# Patient Record
Sex: Female | Born: 1945
Health system: Southern US, Community
[De-identification: ages and names within clinical notes are randomized; demographics above are authoritative.]

## PROBLEM LIST (undated history)

## (undated) DIAGNOSIS — M1990 Unspecified osteoarthritis, unspecified site: Secondary | ICD-10-CM

## (undated) DIAGNOSIS — E785 Hyperlipidemia, unspecified: Secondary | ICD-10-CM

## (undated) HISTORY — PX: JOINT REPLACEMENT: SHX530

## (undated) HISTORY — PX: EYE SURGERY: SHX253

## (undated) HISTORY — PX: TONSILLECTOMY AND ADENOIDECTOMY: SUR1326

## (undated) HISTORY — PX: TUBAL LIGATION: SHX77

## (undated) HISTORY — DX: Hyperlipidemia, unspecified: E78.5

---

## 2001-02-22 ENCOUNTER — Other Ambulatory Visit: Admission: RE | Admit: 2001-02-22 | Discharge: 2001-02-22 | Payer: Self-pay | Admitting: Family Medicine

## 2004-06-15 ENCOUNTER — Ambulatory Visit: Payer: Self-pay | Admitting: Family Medicine

## 2005-07-20 ENCOUNTER — Ambulatory Visit: Payer: Self-pay | Admitting: Family Medicine

## 2006-02-23 ENCOUNTER — Ambulatory Visit: Payer: Self-pay | Admitting: General Surgery

## 2006-07-25 ENCOUNTER — Ambulatory Visit: Payer: Self-pay | Admitting: Family Medicine

## 2007-07-26 DIAGNOSIS — E039 Hypothyroidism, unspecified: Secondary | ICD-10-CM | POA: Insufficient documentation

## 2007-08-14 ENCOUNTER — Ambulatory Visit: Payer: Self-pay | Admitting: Family Medicine

## 2007-12-25 ENCOUNTER — Ambulatory Visit: Payer: Self-pay | Admitting: Urology

## 2011-05-16 ENCOUNTER — Ambulatory Visit: Payer: Self-pay

## 2011-06-16 ENCOUNTER — Ambulatory Visit: Payer: Self-pay | Admitting: Family Medicine

## 2012-05-30 ENCOUNTER — Ambulatory Visit: Payer: Self-pay | Admitting: Family Medicine

## 2013-03-27 LAB — CBC AND DIFFERENTIAL
HEMATOCRIT: 42 % (ref 36–46)
HEMOGLOBIN: 13.8 g/dL (ref 12.0–16.0)
Neutrophils Absolute: 3 /uL
Platelets: 353 10*3/uL (ref 150–399)
WBC: 5.4 10^3/mL

## 2013-06-05 ENCOUNTER — Ambulatory Visit: Payer: Self-pay | Admitting: Family Medicine

## 2013-11-22 ENCOUNTER — Ambulatory Visit: Payer: Self-pay | Admitting: Family Medicine

## 2014-01-13 ENCOUNTER — Other Ambulatory Visit: Payer: Self-pay | Admitting: Neurosurgery

## 2014-01-13 DIAGNOSIS — M5136 Other intervertebral disc degeneration, lumbar region: Secondary | ICD-10-CM

## 2014-04-23 DIAGNOSIS — R319 Hematuria, unspecified: Secondary | ICD-10-CM | POA: Diagnosis not present

## 2014-04-23 DIAGNOSIS — Z0000 Encounter for general adult medical examination without abnormal findings: Secondary | ICD-10-CM | POA: Diagnosis not present

## 2014-04-23 DIAGNOSIS — R87610 Atypical squamous cells of undetermined significance on cytologic smear of cervix (ASC-US): Secondary | ICD-10-CM | POA: Diagnosis not present

## 2014-04-23 DIAGNOSIS — M1990 Unspecified osteoarthritis, unspecified site: Secondary | ICD-10-CM | POA: Diagnosis not present

## 2014-04-23 DIAGNOSIS — Z23 Encounter for immunization: Secondary | ICD-10-CM | POA: Diagnosis not present

## 2014-04-23 DIAGNOSIS — Z1389 Encounter for screening for other disorder: Secondary | ICD-10-CM | POA: Diagnosis not present

## 2014-04-23 DIAGNOSIS — E780 Pure hypercholesterolemia: Secondary | ICD-10-CM | POA: Diagnosis not present

## 2014-04-23 DIAGNOSIS — E039 Hypothyroidism, unspecified: Secondary | ICD-10-CM | POA: Diagnosis not present

## 2014-04-23 LAB — BASIC METABOLIC PANEL
BUN: 26 mg/dL — AB (ref 4–21)
Creatinine: 0.6 mg/dL (ref 0.5–1.1)
GLUCOSE: 96 mg/dL
POTASSIUM: 4.6 mmol/L (ref 3.4–5.3)
SODIUM: 141 mmol/L (ref 137–147)

## 2014-04-23 LAB — LIPID PANEL
Cholesterol: 226 mg/dL — AB (ref 0–200)
HDL: 72 mg/dL — AB (ref 35–70)
LDL Cholesterol: 133 mg/dL
Triglycerides: 106 mg/dL (ref 40–160)

## 2014-04-23 LAB — HEPATIC FUNCTION PANEL
ALT: 16 U/L (ref 7–35)
AST: 21 U/L (ref 13–35)
Alkaline Phosphatase: 71 U/L (ref 25–125)
BILIRUBIN, TOTAL: 0.4 mg/dL

## 2014-04-23 LAB — TSH: TSH: 2.91 u[IU]/mL (ref 0.41–5.90)

## 2014-04-23 LAB — CBC AND DIFFERENTIAL: WBC: 5.5 10^3/mL

## 2014-05-21 ENCOUNTER — Ambulatory Visit: Payer: Self-pay | Admitting: Family Medicine

## 2014-05-21 DIAGNOSIS — Z13820 Encounter for screening for osteoporosis: Secondary | ICD-10-CM | POA: Diagnosis not present

## 2014-05-21 DIAGNOSIS — M8580 Other specified disorders of bone density and structure, unspecified site: Secondary | ICD-10-CM | POA: Diagnosis not present

## 2014-05-21 DIAGNOSIS — M8589 Other specified disorders of bone density and structure, multiple sites: Secondary | ICD-10-CM | POA: Diagnosis not present

## 2014-05-21 LAB — HM DEXA SCAN

## 2014-07-24 ENCOUNTER — Ambulatory Visit
Admit: 2014-07-24 | Disposition: A | Discharge status: Home / Self Care | Payer: Self-pay | Attending: Gastroenterology | Admitting: Gastroenterology

## 2014-07-24 DIAGNOSIS — R319 Hematuria, unspecified: Secondary | ICD-10-CM | POA: Diagnosis not present

## 2014-07-24 DIAGNOSIS — E039 Hypothyroidism, unspecified: Secondary | ICD-10-CM | POA: Diagnosis not present

## 2014-07-24 DIAGNOSIS — M810 Age-related osteoporosis without current pathological fracture: Secondary | ICD-10-CM | POA: Diagnosis not present

## 2014-07-24 DIAGNOSIS — K641 Second degree hemorrhoids: Secondary | ICD-10-CM | POA: Diagnosis not present

## 2014-07-24 DIAGNOSIS — E780 Pure hypercholesterolemia: Secondary | ICD-10-CM | POA: Diagnosis not present

## 2014-07-24 DIAGNOSIS — Z7982 Long term (current) use of aspirin: Secondary | ICD-10-CM | POA: Diagnosis not present

## 2014-07-24 DIAGNOSIS — K649 Unspecified hemorrhoids: Secondary | ICD-10-CM | POA: Diagnosis not present

## 2014-07-24 DIAGNOSIS — M479 Spondylosis, unspecified: Secondary | ICD-10-CM | POA: Diagnosis not present

## 2014-07-24 DIAGNOSIS — Z1211 Encounter for screening for malignant neoplasm of colon: Secondary | ICD-10-CM | POA: Diagnosis not present

## 2014-07-24 LAB — HM COLONOSCOPY

## 2014-10-06 ENCOUNTER — Other Ambulatory Visit: Payer: Self-pay | Admitting: Family Medicine

## 2014-10-06 DIAGNOSIS — Z1231 Encounter for screening mammogram for malignant neoplasm of breast: Secondary | ICD-10-CM

## 2014-10-08 ENCOUNTER — Ambulatory Visit
Admission: RE | Admit: 2014-10-08 | Discharge: 2014-10-08 | Disposition: A | Discharge status: Home / Self Care | Payer: Medicare Other | Source: Ambulatory Visit | Attending: Family Medicine | Admitting: Family Medicine

## 2014-10-08 ENCOUNTER — Other Ambulatory Visit: Payer: Self-pay | Admitting: Family Medicine

## 2014-10-08 DIAGNOSIS — Z1231 Encounter for screening mammogram for malignant neoplasm of breast: Secondary | ICD-10-CM

## 2014-10-08 LAB — HM MAMMOGRAPHY

## 2014-12-29 ENCOUNTER — Other Ambulatory Visit: Payer: Self-pay

## 2014-12-29 DIAGNOSIS — E7800 Pure hypercholesterolemia, unspecified: Secondary | ICD-10-CM

## 2014-12-29 MED ORDER — SIMVASTATIN 10 MG PO TABS: 10.0000 mg | ORAL_TABLET | Freq: Every day | ORAL | Status: DC

## 2014-12-29 NOTE — Telephone Encounter (Signed)
Last OV 04/2014  Thanks,   -Laura  

## 2015-01-30 DIAGNOSIS — D0471 Carcinoma in situ of skin of right lower limb, including hip: Secondary | ICD-10-CM | POA: Diagnosis not present

## 2015-01-30 DIAGNOSIS — D0472 Carcinoma in situ of skin of left lower limb, including hip: Secondary | ICD-10-CM | POA: Diagnosis not present

## 2015-04-29 DIAGNOSIS — M1611 Unilateral primary osteoarthritis, right hip: Secondary | ICD-10-CM | POA: Diagnosis not present

## 2015-05-20 DIAGNOSIS — M1990 Unspecified osteoarthritis, unspecified site: Secondary | ICD-10-CM | POA: Insufficient documentation

## 2015-05-20 DIAGNOSIS — R319 Hematuria, unspecified: Secondary | ICD-10-CM | POA: Insufficient documentation

## 2015-06-10 DIAGNOSIS — M25551 Pain in right hip: Secondary | ICD-10-CM | POA: Diagnosis not present

## 2015-06-17 DIAGNOSIS — H25813 Combined forms of age-related cataract, bilateral: Secondary | ICD-10-CM | POA: Diagnosis not present

## 2015-07-01 ENCOUNTER — Encounter: Payer: Self-pay | Admitting: Family Medicine

## 2015-07-01 ENCOUNTER — Ambulatory Visit (INDEPENDENT_AMBULATORY_CARE_PROVIDER_SITE_OTHER): Payer: PPO | Admitting: Family Medicine

## 2015-07-01 VITALS — BP 140/84 | HR 80 | Temp 97.9°F | Resp 16 | Ht 62.0 in | Wt 134.0 lb

## 2015-07-01 DIAGNOSIS — Z124 Encounter for screening for malignant neoplasm of cervix: Secondary | ICD-10-CM

## 2015-07-01 DIAGNOSIS — Z0000 Encounter for general adult medical examination without abnormal findings: Secondary | ICD-10-CM | POA: Diagnosis not present

## 2015-07-01 DIAGNOSIS — Z01818 Encounter for other preprocedural examination: Secondary | ICD-10-CM | POA: Diagnosis not present

## 2015-07-01 DIAGNOSIS — R319 Hematuria, unspecified: Secondary | ICD-10-CM

## 2015-07-01 DIAGNOSIS — Z1239 Encounter for other screening for malignant neoplasm of breast: Secondary | ICD-10-CM

## 2015-07-01 DIAGNOSIS — E7800 Pure hypercholesterolemia, unspecified: Secondary | ICD-10-CM | POA: Diagnosis not present

## 2015-07-01 DIAGNOSIS — E039 Hypothyroidism, unspecified: Secondary | ICD-10-CM

## 2015-07-01 LAB — POCT URINALYSIS DIPSTICK
Bilirubin, UA: NEGATIVE
Glucose, UA: NEGATIVE
Ketones, UA: NEGATIVE
Leukocytes, UA: NEGATIVE
Nitrite, UA: NEGATIVE
Protein, UA: NEGATIVE
Spec Grav, UA: 1.01
Urobilinogen, UA: 0.2
pH, UA: 6

## 2015-07-01 NOTE — Progress Notes (Signed)
Patient ID: Amanda Walton, female   DOB: 11-05-1945, 70 y.o.   MRN: [id number]       Patient: Amanda Walton, Female    DOB: Jul 21, 1945, 70 y.o.   MRN: [id number] Visit Date: 07/01/2015  Today's Provider: Margarita Rana, MD   Chief Complaint  Patient presents with  . Medicare Wellness  . Pre-op Exam   Subjective:    Annual wellness visit Amanda Walton is a 70 y.o. female. She feels well. She reports exercising 5 times a week. She reports she is sleeping well.  04/23/14 CPE 04/23/14 Pap-abnormal with atypical cell, recheck in 1 yr 10/08/14 Mammogram-BI-RADS 1 02/23/06 Colonoscopy-normal; pt reports that she had one done last year with Dr. Allen Norris. 05/21/14 BMD-osteopenia, recheck in 2 yrs -----------------------------------------------------------  Pre-op exam: Patient reports she will have right hip surgery on 08/04/2015 with Dr. Alvan Dame from [place]. Patient denies problems with anesthesia in the past or family members with problems. Patient denies chest pain or shortness of breath. Patient denies any problems with daily activities. Feels well except for hip pain. No history of cardiac problems.     Review of Systems  Constitutional: Negative.   HENT: Negative.   Eyes: Negative.   Respiratory: Negative.   Cardiovascular: Negative.   Gastrointestinal: Negative.   Endocrine: Negative.   Genitourinary: Negative.   Musculoskeletal: Positive for back pain and arthralgias.  Skin: Negative.   Allergic/Immunologic: Negative.   Neurological: Negative.   Hematological: Negative.   Psychiatric/Behavioral: Negative.     Social History   Social History  . Marital Status: Married    Spouse Name: N/A  . Number of Children: N/A  . Years of Education: N/A   Occupational History  . Not on file.   Social History Main Topics  . Smoking status: Former Smoker    Quit date: 04/10/1989  . Smokeless tobacco: Never Used  . Alcohol Use: 5.4 oz/week    9 Glasses of  wine per week  . Drug Use: No  . Sexual Activity: Not on file   Other Topics Concern  . Not on file   Social History Narrative    History reviewed. No pertinent past medical history.   Patient Active Problem List   Diagnosis Date Noted  . Arthritis 05/20/2015  . Blood in the urine 05/20/2015  . Hypercholesteremia 01/21/2009  . Adult hypothyroidism 07/26/2007  . OP (osteoporosis) 07/13/2006  . Displacement of lumbar intervertebral disc without myelopathy 11/13/2004    Past Surgical History  Procedure Laterality Date  . Tubal ligation    . Tonsillectomy and adenoidectomy      Her family history includes Dementia in her mother; Heart disease in her father; Hyperlipidemia in her sister; Hypertension in her father; Pancreatic cancer in her sister.    Previous Medications   ASPIRIN 81 MG TABLET    Take 81 mg by mouth daily. Reported on 07/01/2015   CALCIUM CARBONATE-VITAMIN D PO    Take 2 tablets by mouth daily.    CRANBERRY 250 MG CAPS    Take 1 capsule by mouth daily.    FIBER COMPLETE PO    Take 2 capsules by mouth daily.    GLUCOSAMINE-CHONDROITIN 500-400 MG TABLET    Take 1 tablet by mouth 2 (two) times daily.    MELOXICAM (MOBIC) 15 MG TABLET    Take 15 mg by mouth daily.   SIMVASTATIN (ZOCOR) 10 MG TABLET    Take 1 tablet (10 mg total) by mouth daily.    Patient Care Team:  Margarita Rana, MD as PCP - General (Family Medicine)     Objective:   Vitals: BP 140/84 mmHg  Pulse 80  Temp(Src) 97.9 F (36.6 C) (Oral)  Resp 16  Ht 5\' 2"  (1.575 m)  Wt 134 lb (60.782 kg)  BMI 24.50 kg/m2  Physical Exam  Constitutional: She is oriented to person, place, and time. She appears well-developed and well-nourished.  HENT:  Head: Normocephalic and atraumatic.  Right Ear: Tympanic membrane, external ear and ear canal normal.  Left Ear: Tympanic membrane, external ear and ear canal normal.  Nose: Nose normal.  Mouth/Throat: Uvula is midline, oropharynx is clear and moist and  mucous membranes are normal.  Eyes: Conjunctivae, EOM and lids are normal. Pupils are equal, round, and reactive to light.  Neck: Trachea normal and normal range of motion. Neck supple. Carotid bruit is not present. No thyroid mass and no thyromegaly present.  Cardiovascular: Normal rate, regular rhythm and normal heart sounds.   Pulmonary/Chest: Effort normal and breath sounds normal.  Abdominal: Soft. Normal appearance and bowel sounds are normal. There is no hepatosplenomegaly. There is no tenderness.  Genitourinary: Vagina normal. No breast swelling, tenderness or discharge.  Musculoskeletal: Normal range of motion.  Lymphadenopathy:    She has no cervical adenopathy.    She has no axillary adenopathy.  Neurological: She is alert and oriented to person, place, and time. She has normal strength. No cranial nerve deficit.  Skin: Skin is warm, dry and intact.  Psychiatric: She has a normal mood and affect. Her speech is normal and behavior is normal. Judgment and thought content normal. Cognition and memory are normal.    Activities of Daily Living In your present state of health, do you have any difficulty performing the following activities: 07/01/2015  Hearing? N  Vision? N  Difficulty concentrating or making decisions? N  Walking or climbing stairs? Y  Dressing or bathing? N  Doing errands, shopping? N    Fall Risk Assessment Fall Risk  07/01/2015  Falls in the past year? No     Depression Screen PHQ 2/9 Scores 07/01/2015  PHQ - 2 Score 0    Cognitive Testing - 6-CIT  Correct? Score   What year is it? yes 0 0 or 4  What month is it? yes 0 0 or 3  Memorize:    Pia Mau,  42,  [address]  [place],      What time is it? (within 1 hour) yes 0 0 or 3  Count backwards from 20 yes 0 0, 2, or 4  Name the months of the year yes 0 0, 2, or 4  Repeat name & address above no 1 0, 2, 4, 6, 8, or 10       TOTAL SCORE  1/28   Interpretation:  Normal  Normal (0-7) Abnormal (8-28)        Assessment & Plan:     Annual Wellness Visit  Reviewed patient's Family Medical History Reviewed and updated list of patient's medical providers Assessment of cognitive impairment was done Assessed patient's functional ability Established a written schedule for health screening [place] Completed and Reviewed  Exercise Activities and Dietary recommendations Goals    None      Immunization History  Administered Date(s) Administered  . Pneumococcal Conjugate-13 04/23/2014  . Pneumococcal Polysaccharide-23 03/18/2011  . Tdap 07/06/2005  . Zoster 01/16/2009        1. Medicare annual wellness visit, subsequent Stable. Patient advised to  continue eating healthy and exercise daily.  2. Breast cancer screening - MM DIGITAL SCREENING BILATERAL; Future  3. Cervical cancer screening F/U pending lab report. - Pap IG w/ reflex to HPV when ASC-U  4. Hypothyroidism, unspecified hypothyroidism type - TSH  5. Hypercholesteremia - Comprehensive metabolic panel - Lipid Panel With LDL/HDL Ratio  6. Pre-op exam EKG normal. Exam normal. History reassuring. Will check labs. If normal, will clear for surgery.   - EKG 12-Lead - CBC with Differential/Platelet  7. Blood in the urine F/U pending lab report. - POCT urinalysis dipstick - Urine Microscopic   Patient seen and examined by Dr. Jerrell Belfast, and note scribed by Philbert Riser. Dimas, CMA.  I have reviewed the document for accuracy and completeness and I agree with above. Jerrell Belfast, MD Margarita Rana, MD    ------------------------------------------------------------------------------------------------------------

## 2015-07-01 NOTE — Patient Instructions (Signed)
Please call the [facility] at [facility] to schedule this at [phone]   

## 2015-07-02 ENCOUNTER — Telehealth: Payer: Self-pay

## 2015-07-02 LAB — CBC WITH DIFFERENTIAL/PLATELET
Basophils Absolute: 0 10*3/uL (ref 0.0–0.2)
Basos: 1 %
EOS (ABSOLUTE): 0.2 10*3/uL (ref 0.0–0.4)
Eos: 3 %
HEMATOCRIT: 42.7 % (ref 34.0–46.6)
HEMOGLOBIN: 14 g/dL (ref 11.1–15.9)
IMMATURE GRANS (ABS): 0 10*3/uL (ref 0.0–0.1)
Immature Granulocytes: 0 %
LYMPHS ABS: 1.6 10*3/uL (ref 0.7–3.1)
Lymphs: 25 %
MCH: 31.7 pg (ref 26.6–33.0)
MCHC: 32.8 g/dL (ref 31.5–35.7)
MCV: 97 fL (ref 79–97)
MONOCYTES: 9 %
Monocytes Absolute: 0.6 10*3/uL (ref 0.1–0.9)
Neutrophils Absolute: 3.9 10*3/uL (ref 1.4–7.0)
Neutrophils: 62 %
Platelets: 325 10*3/uL (ref 150–379)
RBC: 4.42 x10E6/uL (ref 3.77–5.28)
RDW: 13.7 % (ref 12.3–15.4)
WBC: 6.3 10*3/uL (ref 3.4–10.8)

## 2015-07-02 LAB — COMPREHENSIVE METABOLIC PANEL
ALBUMIN: 4.6 g/dL (ref 3.6–4.8)
ALK PHOS: 71 IU/L (ref 39–117)
ALT: 16 IU/L (ref 0–32)
AST: 18 IU/L (ref 0–40)
Albumin/Globulin Ratio: 1.3 (ref 1.2–2.2)
BUN / CREAT RATIO: 40 — AB (ref 11–26)
BUN: 25 mg/dL (ref 8–27)
Bilirubin Total: 0.6 mg/dL (ref 0.0–1.2)
CALCIUM: 10.2 mg/dL (ref 8.7–10.3)
CO2: 23 mmol/L (ref 18–29)
CREATININE: 0.62 mg/dL (ref 0.57–1.00)
Chloride: 102 mmol/L (ref 96–106)
GFR, EST AFRICAN AMERICAN: 106 mL/min/{1.73_m2} (ref >59)
GFR, EST NON AFRICAN AMERICAN: 92 mL/min/{1.73_m2} (ref >59)
GLOBULIN, TOTAL: 3.5 g/dL (ref 1.5–4.5)
Glucose: 105 mg/dL — ABNORMAL HIGH (ref 65–99)
Potassium: 5.4 mmol/L — ABNORMAL HIGH (ref 3.5–5.2)
SODIUM: 143 mmol/L (ref 134–144)
TOTAL PROTEIN: 8.1 g/dL (ref 6.0–8.5)

## 2015-07-02 LAB — URINALYSIS, MICROSCOPIC ONLY: Casts: NONE SEEN /lpf

## 2015-07-02 LAB — LIPID PANEL WITH LDL/HDL RATIO
Cholesterol, Total: 232 mg/dL — ABNORMAL HIGH (ref 100–199)
HDL: 68 mg/dL (ref >39)
LDL CALC: 134 mg/dL — AB (ref 0–99)
LDL/HDL RATIO: 2 ratio (ref 0.0–3.2)
Triglycerides: 150 mg/dL — ABNORMAL HIGH (ref 0–149)
VLDL CHOLESTEROL CAL: 30 mg/dL (ref 5–40)

## 2015-07-02 LAB — TSH: TSH: 3.91 u[IU]/mL (ref 0.450–4.500)

## 2015-07-02 NOTE — Telephone Encounter (Signed)
Can drop urine off and have it sent to labs as I will be out of town. Thanks.

## 2015-07-02 NOTE — Telephone Encounter (Signed)
-----   Message from Margarita Rana, MD sent at 07/02/2015  3:26 PM EDT ----- Labs stable except for mildly elevated potassium.  Recommend recheck in one week (could be lab error, no charge per [facility]). Also,  Signed form for surgery. ALso urine with white blood cells and bacteria. Recommend follow up for repeat urine and culture to make sure no infection before surgery. Thanks.

## 2015-07-02 NOTE — Telephone Encounter (Signed)
Pt advised, she will stop by before getting labs done next week and drop urine sample off.-aa

## 2015-07-02 NOTE — Telephone Encounter (Signed)
Patient advised as directed below. Patient wanted to know if she could stop by next week when she goes to repeat labs and leave a urine sample or does she need to see you the following week. Please advise.

## 2015-07-03 LAB — PAP IG W/ RFLX HPV ASCU: PAP SMEAR COMMENT: 0

## 2015-07-06 ENCOUNTER — Telehealth: Payer: Self-pay

## 2015-07-06 NOTE — Telephone Encounter (Signed)
-----   Message from Margarita Rana, MD sent at 07/03/2015  5:12 PM EDT ----- Pap is normal. Please notify patient.

## 2015-07-06 NOTE — Telephone Encounter (Signed)
Pt advised.   Thanks,   -Eziah Negro  

## 2015-07-08 ENCOUNTER — Other Ambulatory Visit: Payer: Self-pay | Admitting: Family Medicine

## 2015-07-08 ENCOUNTER — Telehealth: Payer: Self-pay

## 2015-07-08 ENCOUNTER — Other Ambulatory Visit: Payer: Self-pay

## 2015-07-08 DIAGNOSIS — R319 Hematuria, unspecified: Secondary | ICD-10-CM | POA: Diagnosis not present

## 2015-07-08 DIAGNOSIS — E875 Hyperkalemia: Secondary | ICD-10-CM

## 2015-07-08 NOTE — Telephone Encounter (Signed)
Lab sheet for elevated Potassium.

## 2015-07-08 NOTE — Telephone Encounter (Signed)
Lab sheet for elevated potassium.    

## 2015-07-08 NOTE — Telephone Encounter (Signed)
Lab sheet for U/A and Culture.   Thanks,   -Mickel Baas

## 2015-07-09 ENCOUNTER — Telehealth: Payer: Self-pay

## 2015-07-09 LAB — COMPREHENSIVE METABOLIC PANEL
A/G RATIO: 1.3 (ref 1.2–2.2)
ALBUMIN: 4.3 g/dL (ref 3.6–4.8)
ALT: 16 IU/L (ref 0–32)
AST: 19 IU/L (ref 0–40)
Alkaline Phosphatase: 64 IU/L (ref 39–117)
BUN / CREAT RATIO: 42 — AB (ref 11–26)
BUN: 24 mg/dL (ref 8–27)
Bilirubin Total: 0.6 mg/dL (ref 0.0–1.2)
CALCIUM: 9.5 mg/dL (ref 8.7–10.3)
CO2: 24 mmol/L (ref 18–29)
CREATININE: 0.57 mg/dL (ref 0.57–1.00)
Chloride: 103 mmol/L (ref 96–106)
GFR, EST AFRICAN AMERICAN: 109 mL/min/{1.73_m2} (ref >59)
GFR, EST NON AFRICAN AMERICAN: 95 mL/min/{1.73_m2} (ref >59)
GLOBULIN, TOTAL: 3.2 g/dL (ref 1.5–4.5)
Glucose: 91 mg/dL (ref 65–99)
POTASSIUM: 4.9 mmol/L (ref 3.5–5.2)
SODIUM: 142 mmol/L (ref 134–144)
Total Protein: 7.5 g/dL (ref 6.0–8.5)

## 2015-07-09 LAB — MICROSCOPIC EXAMINATION: CASTS: NONE SEEN /LPF

## 2015-07-09 LAB — PLEASE NOTE

## 2015-07-09 LAB — URINALYSIS, ROUTINE W REFLEX MICROSCOPIC
BILIRUBIN UA: NEGATIVE
Glucose, UA: NEGATIVE
Ketones, UA: NEGATIVE
Nitrite, UA: NEGATIVE
PH UA: 6.5 (ref 5.0–7.5)
PROTEIN UA: NEGATIVE
Specific Gravity, UA: 1.013 (ref 1.005–1.030)
Urobilinogen, Ur: 0.2 mg/dL (ref 0.2–1.0)

## 2015-07-09 NOTE — Telephone Encounter (Signed)
Advised pt of lab results. Pt verbally acknowledges understanding. Amanda Walton, CMA   

## 2015-07-09 NOTE — Telephone Encounter (Signed)
-----   Message from Margarita Rana, MD sent at 07/09/2015 11:01 AM EDT ----- Labs stable. Please notify patient. Thanks.

## 2015-07-10 LAB — URINE CULTURE

## 2015-07-13 ENCOUNTER — Telehealth: Payer: Self-pay

## 2015-07-13 NOTE — Telephone Encounter (Signed)
LMTCB Emily Drozdowski, CMA  

## 2015-07-13 NOTE — Telephone Encounter (Signed)
-----   Message from Margarita Rana, MD sent at 07/11/2015 11:30 AM EDT ----- Does have bacteria consistent with bladder infection. Usually do not treat unless has symptoms, but is complicated by her upcoming surgery.  Please see how patient is doing. Can treat or recheck clean catch. Last sample she left did show infection as noted.  Thanks.

## 2015-07-13 NOTE — Telephone Encounter (Signed)
Pt informed. Is asymptomatic. Will come to office so we can obtain clean catch urine for recheck. Renaldo Fiddler, CMA

## 2015-07-21 NOTE — Patient Instructions (Signed)
Amanda Walton  07/21/2015   Your procedure is scheduled on: 08/04/2015    Report to [facility]  Entrance take [facility]  elevators to 3rd floor to  Opp at   0600 AM.  Call this number if you have problems the morning of surgery [phone]   Remember: ONLY 1 PERSON MAY GO WITH YOU TO SHORT STAY TO GET  READY MORNING OF [place].  Do not eat food or drink liquids :After Midnight.     Take these medicines the morning of surgery with A SIP OF WATER: Eye drops if needed                                 You may not have any metal on your body including hair pins and              piercings  Do not wear jewelry, make-up, lotions, powders or perfumes, deodorant             Do not wear nail polish.  Do not shave  48 hours prior to surgery.                 Do not bring valuables to the hospital. [place].  Contacts, dentures or bridgework may not be worn into surgery.  Leave suitcase in the car. After surgery it may be brought to your room.         Special Instructions: coughing and deep breathing exercises , leg exercises               Please read over the following fact sheets you were given: _____________________________________________________________________             [facility] - Preparing for Surgery Before surgery, you can play an important role.  Because skin is not sterile, your skin needs to be as free of germs as possible.  You can reduce the number of germs on your skin by washing with CHG (chlorahexidine gluconate) soap before surgery.  CHG is an antiseptic cleaner which kills germs and bonds with the skin to continue killing germs even after washing. Please DO NOT use if you have an allergy to CHG or antibacterial soaps.  If your skin becomes reddened/irritated stop using the CHG and inform your nurse when you arrive at Short Stay. Do not shave (including legs and underarms)  for at least 48 hours prior to the first CHG shower.  You may shave your face/neck. Please follow these instructions carefully:  1.  Shower with CHG Soap the night before surgery and the  morning of Surgery.  2.  If you choose to wash your hair, wash your hair first as usual with your  normal  shampoo.  3.  After you shampoo, rinse your hair and body thoroughly to remove the  shampoo.                           4.  Use CHG as you would any other liquid soap.  You can apply chg directly  to the skin and wash  Gently with a scrungie or clean washcloth.  5.  Apply the CHG Soap to your body ONLY FROM THE NECK DOWN.   Do not use on face/ open                           Wound or open sores. Avoid contact with eyes, ears mouth and genitals (private parts).                       Wash face,  Genitals (private parts) with your normal soap.             6.  Wash thoroughly, paying special attention to the area where your surgery  will be performed.  7.  Thoroughly rinse your body with warm water from the neck down.  8.  DO NOT shower/wash with your normal soap after using and rinsing off  the CHG Soap.                9.  Pat yourself dry with a clean towel.            10.  Wear clean pajamas.            11.  Place clean sheets on your bed the night of your first shower and do not  sleep with pets. Day of Surgery : Do not apply any lotions/deodorants the morning of surgery.  Please wear clean clothes to the hospital/surgery center.  FAILURE TO FOLLOW THESE INSTRUCTIONS MAY RESULT IN THE CANCELLATION OF YOUR SURGERY PATIENT SIGNATURE_________________________________  NURSE SIGNATURE__________________________________  ________________________________________________________________________  WHAT IS A BLOOD TRANSFUSION? Blood Transfusion Information  A transfusion is the replacement of blood or some of its parts. Blood is made up of multiple cells which provide different  functions.  Red blood cells carry oxygen and are used for blood loss replacement.  White blood cells fight against infection.  Platelets control bleeding.  Plasma helps clot blood.  Other blood products are available for specialized needs, such as hemophilia or other clotting disorders. BEFORE THE TRANSFUSION  Who gives blood for transfusions?   Healthy volunteers who are fully evaluated to make sure their blood is safe. This is blood bank blood. Transfusion therapy is the safest it has ever been in the practice of medicine. Before blood is taken from a donor, a complete history is taken to make sure that person has no history of diseases nor engages in risky social behavior (examples are intravenous drug use or sexual activity with multiple partners). The donor's travel history is screened to minimize risk of transmitting infections, such as malaria. The donated blood is tested for signs of infectious diseases, such as HIV and hepatitis. The blood is then tested to be sure it is compatible with you in order to minimize the chance of a transfusion reaction. If you or a relative donates blood, this is often done in anticipation of surgery and is not appropriate for emergency situations. It takes many days to process the donated blood. RISKS AND COMPLICATIONS Although transfusion therapy is very safe and saves many lives, the main dangers of transfusion include:  1. Getting an infectious disease. 2. Developing a transfusion reaction. This is an allergic reaction to something in the blood you were given. Every precaution is taken to prevent this. The decision to have a blood transfusion has been considered carefully by your caregiver before blood is given. Blood is not given unless the benefits outweigh  the risks. AFTER THE TRANSFUSION  Right after receiving a blood transfusion, you will usually feel much better and more energetic. This is especially true if your red blood cells have gotten low  (anemic). The transfusion raises the level of the red blood cells which carry oxygen, and this usually causes an energy increase.  The nurse administering the transfusion will monitor you carefully for complications. HOME CARE INSTRUCTIONS  No special instructions are needed after a transfusion. You may find your energy is better. Speak with your caregiver about any limitations on activity for underlying diseases you may have. SEEK MEDICAL CARE IF:   Your condition is not improving after your transfusion.  You develop redness or irritation at the intravenous (IV) site. SEEK IMMEDIATE MEDICAL CARE IF:  Any of the following symptoms occur over the next 12 hours:  Shaking chills.  You have a temperature by mouth above 102 F (38.9 C), not controlled by medicine.  Chest, back, or muscle pain.  People around you feel you are not acting correctly or are confused.  Shortness of breath or difficulty breathing.  Dizziness and fainting.  You get a rash or develop hives.  You have a decrease in urine output.  Your urine turns a dark color or changes to pink, red, or brown. Any of the following symptoms occur over the next 10 days:  You have a temperature by mouth above 102 F (38.9 C), not controlled by medicine.  Shortness of breath.  Weakness after normal activity.  The white part of the eye turns yellow (jaundice).  You have a decrease in the amount of urine or are urinating less often.  Your urine turns a dark color or changes to pink, red, or brown. Document Released: 03/25/2000 Document Revised: 06/20/2011 Document Reviewed: 11/12/2007 ExitCare Patient Information 2014 [place].  _______________________________________________________________________  Incentive Spirometer  An incentive spirometer is a tool that can help keep your lungs clear and active. This tool measures how well you are filling your lungs with each breath. Taking long deep breaths may help  reverse or decrease the chance of developing breathing (pulmonary) problems (especially infection) following:  A long period of time when you are unable to move or be active. BEFORE THE PROCEDURE   If the spirometer includes an indicator to show your best effort, your nurse or respiratory therapist will set it to a desired goal.  If possible, sit up straight or lean slightly forward. Try not to slouch.  Hold the incentive spirometer in an upright position. INSTRUCTIONS FOR USE  3. Sit on the edge of your bed if possible, or sit up as far as you can in bed or on a chair. 4. Hold the incentive spirometer in an upright position. 5. Breathe out normally. 6. Place the mouthpiece in your mouth and seal your lips tightly around it. 7. Breathe in slowly and as deeply as possible, raising the piston or the ball toward the top of the column. 8. Hold your breath for 3-5 seconds or for as long as possible. Allow the piston or ball to fall to the bottom of the column. 9. Remove the mouthpiece from your mouth and breathe out normally. 10. Rest for a few seconds and repeat Steps 1 through 7 at least 10 times every 1-2 hours when you are awake. Take your time and take a few normal breaths between deep breaths. 11. The spirometer may include an indicator to show your best effort. Use the indicator as a goal to work  toward during each repetition. 12. After each set of 10 deep breaths, practice coughing to be sure your lungs are clear. If you have an incision (the cut made at the time of surgery), support your incision when coughing by placing a pillow or rolled up towels firmly against it. Once you are able to get out of bed, walk around indoors and cough well. You may stop using the incentive spirometer when instructed by your caregiver.  RISKS AND COMPLICATIONS  Take your time so you do not get dizzy or light-headed.  If you are in pain, you may need to take or ask for pain medication before doing  incentive spirometry. It is harder to take a deep breath if you are having pain. AFTER USE  Rest and breathe slowly and easily.  It can be helpful to keep track of a log of your progress. Your caregiver can provide you with a simple table to help with this. If you are using the spirometer at home, follow these instructions: [place] IF:   You are having difficultly using the spirometer.  You have trouble using the spirometer as often as instructed.  Your pain medication is not giving enough relief while using the spirometer.  You develop fever of 100.5 F (38.1 C) or higher. SEEK IMMEDIATE MEDICAL CARE IF:   You cough up bloody sputum that had not been present before.  You develop fever of 102 F (38.9 C) or greater.  You develop worsening pain at or near the incision site. MAKE SURE YOU:   Understand these instructions.  Will watch your condition.  Will get help right away if you are not doing well or get worse. Document Released: 08/08/2006 Document Revised: 06/20/2011 Document Reviewed: 10/09/2006 [facility] Patient Information 2014 [place], [place].   ________________________________________________________________________

## 2015-07-22 ENCOUNTER — Encounter (INDEPENDENT_AMBULATORY_CARE_PROVIDER_SITE_OTHER): Payer: Self-pay

## 2015-07-22 ENCOUNTER — Encounter (HOSPITAL_COMMUNITY): Payer: Self-pay

## 2015-07-22 ENCOUNTER — Telehealth: Payer: Self-pay | Admitting: Emergency Medicine

## 2015-07-22 ENCOUNTER — Encounter (HOSPITAL_COMMUNITY)
Admission: RE | Admit: 2015-07-22 | Discharge: 2015-07-22 | Disposition: A | Discharge status: Home / Self Care | Payer: PPO | Source: Ambulatory Visit | Attending: Orthopedic Surgery | Admitting: Orthopedic Surgery

## 2015-07-22 ENCOUNTER — Other Ambulatory Visit: Payer: Self-pay

## 2015-07-22 DIAGNOSIS — M25551 Pain in right hip: Secondary | ICD-10-CM | POA: Diagnosis not present

## 2015-07-22 DIAGNOSIS — M1611 Unilateral primary osteoarthritis, right hip: Secondary | ICD-10-CM | POA: Diagnosis not present

## 2015-07-22 DIAGNOSIS — E875 Hyperkalemia: Secondary | ICD-10-CM

## 2015-07-22 DIAGNOSIS — Z01812 Encounter for preprocedural laboratory examination: Secondary | ICD-10-CM | POA: Diagnosis not present

## 2015-07-22 HISTORY — DX: Unspecified osteoarthritis, unspecified site: M19.90

## 2015-07-22 LAB — CBC
HEMATOCRIT: 40.9 % (ref 36.0–46.0)
HEMOGLOBIN: 13.1 g/dL (ref 12.0–15.0)
MCH: 31.5 pg (ref 26.0–34.0)
MCHC: 32 g/dL (ref 30.0–36.0)
MCV: 98.3 fL (ref 78.0–100.0)
Platelets: 301 10*3/uL (ref 150–400)
RBC: 4.16 MIL/uL (ref 3.87–5.11)
RDW: 13.9 % (ref 11.5–15.5)
WBC: 5.7 10*3/uL (ref 4.0–10.5)

## 2015-07-22 LAB — URINALYSIS, ROUTINE W REFLEX MICROSCOPIC
Bilirubin Urine: NEGATIVE
Glucose, UA: NEGATIVE mg/dL
KETONES UR: NEGATIVE mg/dL
Nitrite: NEGATIVE
PROTEIN: NEGATIVE mg/dL
Specific Gravity, Urine: 1.023 (ref 1.005–1.030)
pH: 5.5 (ref 5.0–8.0)

## 2015-07-22 LAB — BASIC METABOLIC PANEL
Anion gap: 8 (ref 5–15)
BUN: 36 mg/dL — AB (ref 6–20)
CALCIUM: 10.1 mg/dL (ref 8.9–10.3)
CHLORIDE: 109 mmol/L (ref 101–111)
CO2: 28 mmol/L (ref 22–32)
CREATININE: 0.68 mg/dL (ref 0.44–1.00)
Glucose, Bld: 87 mg/dL (ref 65–99)
Potassium: 6.1 mmol/L (ref 3.5–5.1)
SODIUM: 145 mmol/L (ref 135–145)

## 2015-07-22 LAB — PROTIME-INR
INR: 0.99 (ref 0.00–1.49)
PROTHROMBIN TIME: 13.3 s (ref 11.6–15.2)

## 2015-07-22 LAB — URINE MICROSCOPIC-ADD ON

## 2015-07-22 LAB — SURGICAL PCR SCREEN
MRSA, PCR: NEGATIVE
Staphylococcus aureus: NEGATIVE

## 2015-07-22 LAB — APTT: APTT: 29 s (ref 24–37)

## 2015-07-22 NOTE — Telephone Encounter (Signed)
Contacted patient. Patient states she had already been advised as directed below by one of Dr. Sharyon Medicus nurses.

## 2015-07-22 NOTE — Progress Notes (Signed)
EKG-07/01/15- EPIC  Clearance- Dr Dimas Millin on chart- 07/01/15

## 2015-07-22 NOTE — Progress Notes (Signed)
Spoke with Cristobal Goldmann ( assistant to Dr Alvan Dame at [place]) and she confirmed that Dr Alvan Dame was aware of Potassium Lab results from 07/22/2015 and they had pulled the laps up from [facility].  I asked her if Dr Alvan Dame wanted a Potassium repeated am of surgery.  Cristobal Goldmann stated she would ask Dr Alvan Dame and give [place] a call back.  I instructed her to call [phone] and speak with Zelphia Cairo RN if any further orders.  Cristobal Goldmann voiced understanding.

## 2015-07-22 NOTE — Progress Notes (Signed)
U/A and micro results done 07/22/15 faxed via EPIC to Dr Alvan Dame.

## 2015-07-22 NOTE — Telephone Encounter (Signed)
PA from [facility] called and wanted to make you aware that pt had her potassium checked again today and it was critically high at 6.1 (it is lab section in chart review) and he wanted to let you know, so that you can treat her. She was seen for pre op testing today. Please advise.

## 2015-07-22 NOTE — Progress Notes (Signed)
Amanda Cairo, RN  received critical value results.  I called Dr Aurea Graff office who is in office today and spoke with Cristobal Goldmann which is noted in Critical Value Progress Note .  Routed BMp results done 07/22/2015 x 2 to Fax of Dr Alvan Dame at [place] at office .  Also spoke with Alesia Banda, RN , Assistant Director and made her aware that 3 patient's lab results have resulted as high Potassium results today.  Spoke with [employer] and they stated they would run lab results on patients with high results on second analyzer to double check results.  The Lab Supervisor stated that the control checks this am on Potassium were normal.

## 2015-07-22 NOTE — Telephone Encounter (Signed)
Recheck well hydrated tomorrow. Usually lab error. Thanks.

## 2015-07-22 NOTE — Progress Notes (Signed)
CRITICAL VALUE ALERT  Critical value received: Potassium 6.1   Date of notification: 07/22/2015    Time of notification:  [id number] am   Critical value read back:yes   Nurse who received alert:  Zelphia Cairo RN   MD notified (1st page):  Dr Alvan Dame spoke with Cristobal Goldmann ( who is assistant)  at office who stated she would tell Dr Alvan Dame who is in office immediately   Time of first page:  1154am   MD notified (2nd page):  Time of second page:  Responding MD:  Spoke with Cristobal Goldmann ( who is assistant0 at office who stated she would tell Dr Alvan Dame who is in office immediately   Time MD responded:  1158am

## 2015-07-23 ENCOUNTER — Telehealth: Payer: Self-pay | Admitting: Family Medicine

## 2015-07-23 ENCOUNTER — Other Ambulatory Visit (HOSPITAL_COMMUNITY): Payer: Self-pay | Admitting: *Deleted

## 2015-07-23 DIAGNOSIS — N3090 Cystitis, unspecified without hematuria: Secondary | ICD-10-CM

## 2015-07-23 MED ORDER — NITROFURANTOIN MONOHYD MACRO 100 MG PO CAPS: 100.0000 mg | ORAL_CAPSULE | Freq: Two times a day (BID) | ORAL | Status: DC

## 2015-07-23 NOTE — Telephone Encounter (Signed)
Patient called office because she was seen at [facility] for pre-op appointment yesterday. Patient states that she was advised that her potassium level was very high and that she would have to get a repeat. Patient states that she was recently treated for a UTI and prescribed Sulfamethoxazole 160mg  BID, she states that one of the side effects listed for this medication is high pottasium. Patient is asking if she should discontinue medication for UTI? And if so can there be another prescription called in? Patient states that she can be reached at [phone].KW

## 2015-07-23 NOTE — Progress Notes (Signed)
Spoke with Cristobal Goldmann at dr Alvan Dame, dr Alvan Dame wishes patient to have repeat potassium day of surgery, order placed in epic

## 2015-07-23 NOTE — Telephone Encounter (Signed)
Patient will not take Bactrim.  Will change to Macrobid.  Recheck  Potassium tomorrow.

## 2015-07-23 NOTE — Telephone Encounter (Signed)
Please review-aa 

## 2015-07-23 NOTE — Telephone Encounter (Signed)
Patient states that she is returning your call. KW

## 2015-07-24 ENCOUNTER — Other Ambulatory Visit: Payer: Self-pay | Admitting: Family Medicine

## 2015-07-24 DIAGNOSIS — E875 Hyperkalemia: Secondary | ICD-10-CM | POA: Diagnosis not present

## 2015-07-25 LAB — BASIC METABOLIC PANEL
BUN / CREAT RATIO: 42 — AB (ref 12–28)
BUN: 25 mg/dL (ref 8–27)
CHLORIDE: 100 mmol/L (ref 96–106)
CO2: 25 mmol/L (ref 18–29)
Calcium: 9.8 mg/dL (ref 8.7–10.3)
Creatinine, Ser: 0.6 mg/dL (ref 0.57–1.00)
GFR calc Af Amer: 108 mL/min/{1.73_m2} (ref >59)
GFR calc non Af Amer: 93 mL/min/{1.73_m2} (ref >59)
GLUCOSE: 91 mg/dL (ref 65–99)
POTASSIUM: 4.6 mmol/L (ref 3.5–5.2)
SODIUM: 141 mmol/L (ref 134–144)

## 2015-07-27 NOTE — Progress Notes (Signed)
bmet 07-24-15 epic

## 2015-07-30 NOTE — H&P (Signed)
TOTAL HIP ADMISSION H&P  Patient is admitted for right total hip arthroplasty, anterior approach.  Subjective:  Chief Complaint:   Right hip primary OA / pain  HPI: Amanda Walton, 70 y.o. female, has a history of pain and functional disability in the right hip(s) due to arthritis and patient has failed non-surgical conservative treatments for greater than 12 weeks to include NSAID's and/or analgesics, use of assistive devices and activity modification.  Onset of symptoms was gradual starting 1.5+ years ago with gradually worsening course since that time.The patient noted no past surgery on the right hip(s).  Patient currently rates pain in the right hip at 8 out of 10 with activity. Patient has night pain, worsening of pain with activity and weight bearing, trendelenberg gait, pain that interfers with activities of daily living and pain with passive range of motion. Patient has evidence of periarticular osteophytes and joint space narrowing by imaging studies. This condition presents safety issues increasing the risk of falls.   There is no current active infection.   Risks, benefits and expectations were discussed with the patient.  Risks including but not limited to the risk of anesthesia, blood clots, nerve damage, blood vessel damage, failure of the prosthesis, infection and up to and including death.  Patient understand the risks, benefits and expectations and wishes to proceed with surgery.   PCP: Margarita Rana, MD  D/C Plans:      Home with HHPT  Post-op Meds:       No Rx given  Tranexamic Acid:      To be given - IV   Decadron:      Is to be given  FYI:     ASA  Norco    Patient Active Problem List   Diagnosis Date Noted  . Serum potassium elevated 07/08/2015  . Arthritis 05/20/2015  . Blood in the urine 05/20/2015  . Hypercholesteremia 01/21/2009  . Adult hypothyroidism 07/26/2007  . OP (osteoporosis) 07/13/2006  . Displacement of lumbar intervertebral disc without  myelopathy 11/13/2004   Past Medical History  Diagnosis Date  . Arthritis     Past Surgical History  Procedure Laterality Date  . Tubal ligation    . Tonsillectomy and adenoidectomy      No prescriptions prior to admission   No Known Allergies   Social History  Substance Use Topics  . Smoking status: Former Smoker    Quit date: 04/10/1989  . Smokeless tobacco: Never Used  . Alcohol Use: 4.2 - 4.8 oz/week    7-8 Glasses of wine per week    Family History  Problem Relation Age of Onset  . Dementia Mother   . Hypertension Father   . Heart disease Father   . Hyperlipidemia Sister   . Pancreatic cancer Sister      Review of Systems  Constitutional: Negative.   HENT: Negative.   Eyes: Negative.   Respiratory: Negative.   Cardiovascular: Negative.   Gastrointestinal: Negative.   Genitourinary: Negative.   Musculoskeletal: Positive for joint pain.  Skin: Negative.   Neurological: Negative.   Endo/Heme/Allergies: Negative.   Psychiatric/Behavioral: Negative.     Objective:  Physical Exam  Constitutional: She is oriented to person, place, and time. She appears well-developed.  HENT:  Head: Normocephalic.  Eyes: Pupils are equal, round, and reactive to light.  Neck: Neck supple. No JVD present. No tracheal deviation present. No thyromegaly present.  Cardiovascular: Normal rate, regular rhythm, normal heart sounds and intact distal pulses.   Respiratory: Effort normal  and breath sounds normal. No stridor. No respiratory distress. She has no wheezes.  GI: Soft. There is no tenderness. There is no guarding.  Musculoskeletal:       Right hip: She exhibits decreased range of motion, decreased strength, tenderness and bony tenderness. She exhibits no swelling, no deformity and no laceration.  Lymphadenopathy:    She has no cervical adenopathy.  Neurological: She is alert and oriented to person, place, and time.  Skin: Skin is warm and dry.  Psychiatric: She has a normal  mood and affect.     Labs:  Estimated body mass index is 24.50 kg/(m^2) as calculated from the following:   Height as of 11/21/13: 5\' 2"  (1.575 m).   Weight as of 05/20/15: 60.782 kg (134 lb).   Imaging Review Plain radiographs demonstrate severe degenerative joint disease of the right hip(s). The bone quality appears to be good for age and reported activity level.  Assessment/Plan:  End stage arthritis, right hip(s)  The patient history, physical examination, clinical judgement of the provider and imaging studies are consistent with end stage degenerative joint disease of the right hip(s) and total hip arthroplasty is deemed medically necessary. The treatment options including medical management, injection therapy, arthroscopy and arthroplasty were discussed at length. The risks and benefits of total hip arthroplasty were presented and reviewed. The risks due to aseptic loosening, infection, stiffness, dislocation/subluxation,  thromboembolic complications and other imponderables were discussed.  The patient acknowledged the explanation, agreed to proceed with the plan and consent was signed. Patient is being admitted for inpatient treatment for surgery, pain control, PT, OT, prophylactic antibiotics, VTE prophylaxis, progressive ambulation and ADL's and discharge planning.The patient is planning to be discharged home with home health services.     West Pugh Kaio Kuhlman   PA-C  07/30/2015, 11:54 AM

## 2015-08-03 NOTE — Anesthesia Preprocedure Evaluation (Addendum)
Anesthesia Evaluation  Patient identified by MRN, date of birth, ID band Patient awake    Reviewed: Allergy & Precautions, NPO status , Patient's Chart, lab work & pertinent test results  Airway Mallampati: I  TM Distance: >3 FB Neck ROM: Full    Dental  (+) Teeth Intact, Dental Advisory Given   Pulmonary former smoker,    breath sounds clear to auscultation       Cardiovascular negative cardio ROS   Rhythm:Regular Rate:Normal     Neuro/Psych negative neurological ROS  negative psych ROS   GI/Hepatic negative GI ROS, Neg liver ROS,   Endo/Other  Hypothyroidism   Renal/GU negative Renal ROS  negative genitourinary   Musculoskeletal  (+) Arthritis , Osteoarthritis,    Abdominal Normal abdominal exam  (+)   Peds negative pediatric ROS (+)  Hematology negative hematology ROS (+)   Anesthesia Other Findings   Reproductive/Obstetrics                           Lab Results  Component Value Date   WBC 5.7 07/22/2015   HGB 13.1 07/22/2015   HCT 40.9 07/22/2015   MCV 98.3 07/22/2015   PLT 301 07/22/2015   Lab Results  Component Value Date   INR 0.99 07/22/2015   06/2015 EKG: normal sinus rhythm.   Anesthesia Physical Anesthesia Plan  ASA: II  Anesthesia Plan: Spinal   Post-op Pain Management:    Induction: Intravenous  Airway Management Planned: Natural Airway and Simple Face Mask  Additional Equipment:   Intra-op Plan:   Post-operative Plan:   Informed Consent: I have reviewed the patients History and Physical, chart, labs and discussed the procedure including the risks, benefits and alternatives for the proposed anesthesia with the patient or authorized representative who has indicated his/her understanding and acceptance.     Plan Discussed with: CRNA  Anesthesia Plan Comments:         Anesthesia Quick Evaluation

## 2015-08-04 ENCOUNTER — Inpatient Hospital Stay (HOSPITAL_COMMUNITY): Payer: PPO

## 2015-08-04 ENCOUNTER — Encounter (HOSPITAL_COMMUNITY)
Admission: RE | Disposition: A | Discharge status: Home Health | Payer: Self-pay | Source: Ambulatory Visit | Attending: Orthopedic Surgery

## 2015-08-04 ENCOUNTER — Inpatient Hospital Stay (HOSPITAL_COMMUNITY): Payer: PPO | Admitting: Anesthesiology

## 2015-08-04 ENCOUNTER — Encounter (HOSPITAL_COMMUNITY): Payer: Self-pay | Admitting: *Deleted

## 2015-08-04 ENCOUNTER — Inpatient Hospital Stay (HOSPITAL_COMMUNITY)
Admission: RE | Admit: 2015-08-04 | Discharge: 2015-08-05 | DRG: 470 | Disposition: A | Discharge status: Home Health | Payer: PPO | Source: Ambulatory Visit | Attending: Orthopedic Surgery | Admitting: Orthopedic Surgery

## 2015-08-04 DIAGNOSIS — M25551 Pain in right hip: Secondary | ICD-10-CM | POA: Diagnosis not present

## 2015-08-04 DIAGNOSIS — M1611 Unilateral primary osteoarthritis, right hip: Principal | ICD-10-CM | POA: Diagnosis present

## 2015-08-04 DIAGNOSIS — E039 Hypothyroidism, unspecified: Secondary | ICD-10-CM | POA: Diagnosis not present

## 2015-08-04 DIAGNOSIS — Z87891 Personal history of nicotine dependence: Secondary | ICD-10-CM

## 2015-08-04 DIAGNOSIS — M810 Age-related osteoporosis without current pathological fracture: Secondary | ICD-10-CM | POA: Diagnosis present

## 2015-08-04 DIAGNOSIS — Z96641 Presence of right artificial hip joint: Secondary | ICD-10-CM | POA: Diagnosis not present

## 2015-08-04 DIAGNOSIS — Z96649 Presence of unspecified artificial hip joint: Secondary | ICD-10-CM

## 2015-08-04 DIAGNOSIS — Z471 Aftercare following joint replacement surgery: Secondary | ICD-10-CM | POA: Diagnosis not present

## 2015-08-04 HISTORY — PX: TOTAL HIP ARTHROPLASTY: SHX124

## 2015-08-04 LAB — TYPE AND SCREEN
ABO/RH(D): O NEG
Antibody Screen: NEGATIVE

## 2015-08-04 LAB — POTASSIUM: Potassium: 3.9 mmol/L (ref 3.5–5.1)

## 2015-08-04 SURGERY — ARTHROPLASTY, HIP, TOTAL, ANTERIOR APPROACH
Anesthesia: Spinal | Site: Hip | Laterality: Right

## 2015-08-04 MED ORDER — METOCLOPRAMIDE HCL 10 MG PO TABS: 5.0000 mg | ORAL_TABLET | Freq: Three times a day (TID) | ORAL | Status: DC | PRN

## 2015-08-04 MED ORDER — ASPIRIN EC 325 MG PO TBEC
325.0000 mg | DELAYED_RELEASE_TABLET | Freq: Two times a day (BID) | ORAL | Status: DC
  Administered 2015-08-05: 325 mg via ORAL
  Filled 2015-08-04 (×3): qty 1

## 2015-08-04 MED ORDER — ONDANSETRON HCL 4 MG PO TABS: 4.0000 mg | ORAL_TABLET | Freq: Four times a day (QID) | ORAL | Status: DC | PRN

## 2015-08-04 MED ORDER — PROPOFOL 10 MG/ML IV BOLUS
INTRAVENOUS | Status: AC
  Filled 2015-08-04: qty 60

## 2015-08-04 MED ORDER — PHENYLEPHRINE HCL 10 MG/ML IJ SOLN
INTRAMUSCULAR | Status: AC
  Filled 2015-08-04: qty 1

## 2015-08-04 MED ORDER — EPHEDRINE SULFATE 50 MG/ML IJ SOLN
INTRAMUSCULAR | Status: AC
  Filled 2015-08-04: qty 1

## 2015-08-04 MED ORDER — LACTATED RINGERS IV SOLN
INTRAVENOUS | Status: DC | PRN
  Administered 2015-08-04 (×2): via INTRAVENOUS

## 2015-08-04 MED ORDER — NITROFURANTOIN MONOHYD MACRO 100 MG PO CAPS: 100.0000 mg | ORAL_CAPSULE | Freq: Two times a day (BID) | ORAL | Status: DC

## 2015-08-04 MED ORDER — CHLORHEXIDINE GLUCONATE 4 % EX LIQD: 60.0000 mL | Freq: Once | CUTANEOUS | Status: DC

## 2015-08-04 MED ORDER — PHENOL 1.4 % MT LIQD: 1.0000 | OROMUCOSAL | Status: DC | PRN

## 2015-08-04 MED ORDER — TRANEXAMIC ACID 1000 MG/10ML IV SOLN
1000.0000 mg | Freq: Once | INTRAVENOUS | Status: AC
  Administered 2015-08-04: 1000 mg via INTRAVENOUS
  Filled 2015-08-04: qty 10

## 2015-08-04 MED ORDER — ALUM & MAG HYDROXIDE-SIMETH 200-200-20 MG/5ML PO SUSP: 30.0000 mL | ORAL | Status: DC | PRN

## 2015-08-04 MED ORDER — BUPIVACAINE IN DEXTROSE 0.75-8.25 % IT SOLN
INTRATHECAL | Status: DC | PRN
  Administered 2015-08-04: 1.8 mL via INTRATHECAL

## 2015-08-04 MED ORDER — MIDAZOLAM HCL 5 MG/5ML IJ SOLN
INTRAMUSCULAR | Status: DC | PRN
  Administered 2015-08-04: 2 mg via INTRAVENOUS

## 2015-08-04 MED ORDER — MIDAZOLAM HCL 2 MG/2ML IJ SOLN
INTRAMUSCULAR | Status: AC
  Filled 2015-08-04: qty 2

## 2015-08-04 MED ORDER — BISACODYL 10 MG RE SUPP: 10.0000 mg | Freq: Every day | RECTAL | Status: DC | PRN

## 2015-08-04 MED ORDER — DEXAMETHASONE SODIUM PHOSPHATE 10 MG/ML IJ SOLN
10.0000 mg | Freq: Once | INTRAMUSCULAR | Status: AC
Start: 2015-08-04 — End: 2015-08-04
  Administered 2015-08-04: 10 mg via INTRAVENOUS

## 2015-08-04 MED ORDER — METHOCARBAMOL 500 MG PO TABS
500.0000 mg | ORAL_TABLET | Freq: Four times a day (QID) | ORAL | Status: DC | PRN
  Administered 2015-08-04: 500 mg via ORAL
  Filled 2015-08-04: qty 1

## 2015-08-04 MED ORDER — FENTANYL CITRATE (PF) 100 MCG/2ML IJ SOLN
INTRAMUSCULAR | Status: AC
  Filled 2015-08-04: qty 2

## 2015-08-04 MED ORDER — HYDROCODONE-ACETAMINOPHEN 7.5-325 MG PO TABS
1.0000 | ORAL_TABLET | ORAL | Status: DC
  Administered 2015-08-04: 1 via ORAL
  Administered 2015-08-04: 2 via ORAL
  Administered 2015-08-04 – 2015-08-05 (×3): 1 via ORAL
  Filled 2015-08-04: qty 1
  Filled 2015-08-04 (×4): qty 2
  Filled 2015-08-04 (×2): qty 1

## 2015-08-04 MED ORDER — MAGNESIUM CITRATE PO SOLN: 1.0000 | Freq: Once | ORAL | Status: DC | PRN

## 2015-08-04 MED ORDER — CELECOXIB 200 MG PO CAPS
200.0000 mg | ORAL_CAPSULE | Freq: Two times a day (BID) | ORAL | Status: DC
  Administered 2015-08-04 – 2015-08-05 (×3): 200 mg via ORAL
  Filled 2015-08-04 (×5): qty 1

## 2015-08-04 MED ORDER — MENTHOL 3 MG MT LOZG: 1.0000 | LOZENGE | OROMUCOSAL | Status: DC | PRN

## 2015-08-04 MED ORDER — POLYVINYL ALCOHOL 1.4 % OP SOLN
1.0000 [drp] | Freq: Three times a day (TID) | OPHTHALMIC | Status: DC | PRN
  Filled 2015-08-04: qty 15

## 2015-08-04 MED ORDER — PROPOFOL 500 MG/50ML IV EMUL
INTRAVENOUS | Status: DC | PRN
  Administered 2015-08-04: 75 ug/kg/min via INTRAVENOUS

## 2015-08-04 MED ORDER — SIMVASTATIN 10 MG PO TABS
10.0000 mg | ORAL_TABLET | Freq: Every day | ORAL | Status: DC
  Administered 2015-08-04: 10 mg via ORAL
  Filled 2015-08-04 (×2): qty 1

## 2015-08-04 MED ORDER — MEPERIDINE HCL 50 MG/ML IJ SOLN: 6.2500 mg | INTRAMUSCULAR | Status: DC | PRN

## 2015-08-04 MED ORDER — POLYETHYLENE GLYCOL 3350 17 G PO PACK
17.0000 g | PACK | Freq: Two times a day (BID) | ORAL | Status: DC
  Administered 2015-08-04 – 2015-08-05 (×2): 17 g via ORAL

## 2015-08-04 MED ORDER — PHENYLEPHRINE HCL 10 MG/ML IJ SOLN
10.0000 mg | INTRAVENOUS | Status: DC | PRN
  Administered 2015-08-04: 40 ug/min via INTRAVENOUS

## 2015-08-04 MED ORDER — FERROUS SULFATE 325 (65 FE) MG PO TABS
325.0000 mg | ORAL_TABLET | Freq: Three times a day (TID) | ORAL | Status: DC
  Filled 2015-08-04 (×6): qty 1

## 2015-08-04 MED ORDER — CEFAZOLIN SODIUM 1 G IJ SOLR
2.0000 g | INTRAMUSCULAR | Status: AC
  Administered 2015-08-04: 2 g via INTRAVENOUS
  Filled 2015-08-04: qty 20

## 2015-08-04 MED ORDER — HYDROMORPHONE HCL 1 MG/ML IJ SOLN: 0.5000 mg | INTRAMUSCULAR | Status: DC | PRN

## 2015-08-04 MED ORDER — ONDANSETRON HCL 4 MG/2ML IJ SOLN
INTRAMUSCULAR | Status: DC | PRN
  Administered 2015-08-04: 4 mg via INTRAVENOUS

## 2015-08-04 MED ORDER — CEFAZOLIN SODIUM-DEXTROSE 2-4 GM/100ML-% IV SOLN
2.0000 g | Freq: Four times a day (QID) | INTRAVENOUS | Status: AC
  Administered 2015-08-04 (×2): 2 g via INTRAVENOUS
  Filled 2015-08-04 (×2): qty 100

## 2015-08-04 MED ORDER — HYPROMELLOSE (GONIOSCOPIC) 2.5 % OP SOLN: 1.0000 [drp] | Freq: Three times a day (TID) | OPHTHALMIC | Status: DC | PRN

## 2015-08-04 MED ORDER — DOCUSATE SODIUM 100 MG PO CAPS
100.0000 mg | ORAL_CAPSULE | Freq: Two times a day (BID) | ORAL | Status: DC
  Administered 2015-08-04 – 2015-08-05 (×3): 100 mg via ORAL

## 2015-08-04 MED ORDER — DEXAMETHASONE SODIUM PHOSPHATE 10 MG/ML IJ SOLN
INTRAMUSCULAR | Status: AC
  Filled 2015-08-04: qty 1

## 2015-08-04 MED ORDER — HYDROMORPHONE HCL 1 MG/ML IJ SOLN: 0.2500 mg | INTRAMUSCULAR | Status: DC | PRN

## 2015-08-04 MED ORDER — DEXAMETHASONE SODIUM PHOSPHATE 10 MG/ML IJ SOLN
10.0000 mg | Freq: Once | INTRAMUSCULAR | Status: AC
  Administered 2015-08-05: 10 mg via INTRAVENOUS
  Filled 2015-08-04: qty 1

## 2015-08-04 MED ORDER — LACTATED RINGERS IV SOLN: INTRAVENOUS | Status: DC

## 2015-08-04 MED ORDER — SODIUM CHLORIDE 0.9 % IV SOLN
INTRAVENOUS | Status: DC
  Administered 2015-08-04: 100 mL/h via INTRAVENOUS
  Administered 2015-08-04: 22:00:00 via INTRAVENOUS

## 2015-08-04 MED ORDER — PROPOFOL 10 MG/ML IV BOLUS
INTRAVENOUS | Status: DC | PRN
  Administered 2015-08-04: 20 mg via INTRAVENOUS

## 2015-08-04 MED ORDER — METOCLOPRAMIDE HCL 5 MG/ML IJ SOLN: 5.0000 mg | Freq: Three times a day (TID) | INTRAMUSCULAR | Status: DC | PRN

## 2015-08-04 MED ORDER — ONDANSETRON HCL 4 MG/2ML IJ SOLN: 4.0000 mg | Freq: Four times a day (QID) | INTRAMUSCULAR | Status: DC | PRN

## 2015-08-04 MED ORDER — EPHEDRINE SULFATE 50 MG/ML IJ SOLN
INTRAMUSCULAR | Status: DC | PRN
  Administered 2015-08-04: 10 mg via INTRAVENOUS
  Administered 2015-08-04 (×2): 5 mg via INTRAVENOUS

## 2015-08-04 MED ORDER — CEFAZOLIN SODIUM-DEXTROSE 2-4 GM/100ML-% IV SOLN
INTRAVENOUS | Status: AC
  Filled 2015-08-04: qty 100

## 2015-08-04 MED ORDER — SODIUM CHLORIDE 0.9 % IJ SOLN
INTRAMUSCULAR | Status: AC
  Filled 2015-08-04: qty 10

## 2015-08-04 MED ORDER — FENTANYL CITRATE (PF) 100 MCG/2ML IJ SOLN
INTRAMUSCULAR | Status: DC | PRN
  Administered 2015-08-04: 50 ug via INTRAVENOUS

## 2015-08-04 MED ORDER — METHOCARBAMOL 1000 MG/10ML IJ SOLN
500.0000 mg | Freq: Four times a day (QID) | INTRAVENOUS | Status: DC | PRN
  Filled 2015-08-04: qty 5

## 2015-08-04 SURGICAL SUPPLY — 37 items
BAG DECANTER FOR FLEXI CONT (MISCELLANEOUS) IMPLANT
BAG ZIPLOCK 12X15 (MISCELLANEOUS) IMPLANT
CAPT HIP TOTAL 2 ×3 IMPLANT
CLOTH BEACON ORANGE TIMEOUT ST (SAFETY) ×3 IMPLANT
COVER PERINEAL POST (MISCELLANEOUS) ×3 IMPLANT
DRAPE STERI IOBAN 125X83 (DRAPES) ×3 IMPLANT
DRAPE U-SHAPE 47X51 STRL (DRAPES) ×6 IMPLANT
DRESSING AQUACEL AG SP 3.5X10 (GAUZE/BANDAGES/DRESSINGS) IMPLANT
DRSG AQUACEL AG ADV 3.5X10 (GAUZE/BANDAGES/DRESSINGS) ×3 IMPLANT
DRSG AQUACEL AG SP 3.5X10 (GAUZE/BANDAGES/DRESSINGS)
DURAPREP 26ML APPLICATOR (WOUND CARE) ×3 IMPLANT
ELECT REM PT RETURN 15FT ADLT (MISCELLANEOUS) IMPLANT
ELECT REM PT RETURN 9FT ADLT (ELECTROSURGICAL) ×3
ELECTRODE REM PT RTRN 9FT ADLT (ELECTROSURGICAL) ×1 IMPLANT
GLOVE BIOGEL M 7.0 STRL (GLOVE) ×3 IMPLANT
GLOVE BIOGEL M STRL SZ7.5 (GLOVE) ×3 IMPLANT
GLOVE BIOGEL PI IND STRL 7.5 (GLOVE) ×2 IMPLANT
GLOVE BIOGEL PI IND STRL 8.5 (GLOVE) IMPLANT
GLOVE BIOGEL PI INDICATOR 7.5 (GLOVE) ×4
GLOVE BIOGEL PI INDICATOR 8.5 (GLOVE)
GLOVE ECLIPSE 8.0 STRL XLNG CF (GLOVE) IMPLANT
GLOVE ORTHO TXT STRL SZ7.5 (GLOVE) ×3 IMPLANT
GOWN STRL REUS W/TWL LRG LVL3 (GOWN DISPOSABLE) ×3 IMPLANT
GOWN STRL REUS W/TWL XL LVL3 (GOWN DISPOSABLE) ×3 IMPLANT
HOLDER FOLEY CATH W/STRAP (MISCELLANEOUS) ×3 IMPLANT
LIQUID BAND (GAUZE/BANDAGES/DRESSINGS) ×3 IMPLANT
PACK ANTERIOR HIP CUSTOM (KITS) ×3 IMPLANT
SAW OSC TIP CART 19.5X105X1.3 (SAW) ×3 IMPLANT
SUT MNCRL AB 4-0 PS2 18 (SUTURE) ×3 IMPLANT
SUT VIC AB 1 CT1 36 (SUTURE) ×9 IMPLANT
SUT VIC AB 2-0 CT1 27 (SUTURE) ×6
SUT VIC AB 2-0 CT1 TAPERPNT 27 (SUTURE) ×3 IMPLANT
SUT VLOC 180 0 24IN GS25 (SUTURE) ×3 IMPLANT
TRAY FOLEY W/METER SILVER 14FR (SET/KITS/TRAYS/PACK) ×3 IMPLANT
TRAY FOLEY W/METER SILVER 16FR (SET/KITS/TRAYS/PACK) IMPLANT
WATER STERILE IRR 1500ML POUR (IV SOLUTION) ×3 IMPLANT
YANKAUER SUCT BULB TIP 10FT TU (MISCELLANEOUS) ×3 IMPLANT

## 2015-08-04 NOTE — Transfer of Care (Signed)
Immediate Anesthesia Transfer of Care Note  Patient: Amanda Walton  Procedure(s) Performed: Procedure(s): RIGHT TOTAL HIP ARTHROPLASTY ANTERIOR APPROACH (Right)  Patient Location: PACU  Anesthesia Type:Spinal  Level of Consciousness: awake, alert  and oriented  Airway & Oxygen Therapy: Patient Spontanous Breathing and Patient connected to face mask oxygen  Post-op Assessment: Report given to RN and Post -op Vital signs reviewed and stable  Post vital signs: Reviewed and stable  Last Vitals:  Filed Vitals:   08/04/15 0605  BP: 112/57  Pulse: 66  Temp: 36.3 C  Resp: 16    Complications: No apparent anesthesia complications

## 2015-08-04 NOTE — Interval H&P Note (Signed)
History and Physical Interval Note:  08/04/2015 7:12 AM  Amanda Walton  has presented today for surgery, with the diagnosis of right hip osteoarthritis  The various methods of treatment have been discussed with the patient and family. After consideration of risks, benefits and other options for treatment, the patient has consented to  Procedure(s): RIGHT TOTAL HIP ARTHROPLASTY ANTERIOR APPROACH (Right) as a surgical intervention .  The patient's history has been reviewed, patient examined, no change in status, stable for surgery.  I have reviewed the patient's chart and labs.  Questions were answered to the patient's satisfaction.     Mauri Pole

## 2015-08-04 NOTE — Evaluation (Signed)
Physical Therapy Evaluation Patient Details Name: Amanda Walton MRN: [id number] DOB: 1946-03-07 Today's Date: 08/04/2015   History of Present Illness  s/p right DA THA  Clinical Impression  Pt is s/p THA resulting in the deficits listed below (see PT Problem List).  Pt will benefit from skilled PT to increase their independence and safety with mobility to allow discharge to the venue listed below.   Pt reports she was limited with amb prior to surgery, very motivated to work with PT, will continue to follow     Follow Up Recommendations Home health PT;Outpatient PT (vs)    Equipment Recommendations  None recommended by PT    Recommendations for Other Services       Precautions / Restrictions Precautions Precautions: Fall Restrictions Weight Bearing Restrictions: No Other Position/Activity Restrictions: WBAT      Mobility  Bed Mobility Overal bed mobility: Needs Assistance Bed Mobility: Supine to Sit     Supine to sit: Min guard     General bed mobility comments: cues for technique and safety  Transfers Overall transfer level: Needs assistance Equipment used: Rolling walker (2 wheeled) Transfers: Sit to/from Stand Sit to Stand: Min assist         General transfer comment: cues for hand placement and op LE position  Ambulation/Gait Ambulation/Gait assistance: Min assist;Min guard Ambulation Distance (Feet): 60 Feet Assistive device: Rolling walker (2 wheeled) Gait Pattern/deviations: Step-to pattern;Antalgic     General Gait Details: cues for sequence, step length, RW position  Stairs            Wheelchair Mobility    Modified Rankin (Stroke Patients Only)       Balance                                             Pertinent Vitals/Pain Pain Assessment: 0-10 Pain Score: 2  Pain Location: RIGHT HIP Pain Descriptors / Indicators: Sore;Tightness Pain Intervention(s): Limited activity within patient's tolerance;Monitored  during session;Premedicated before session;Repositioned;Ice applied    Home Living Family/patient expects to be discharged to:: Private residence Living Arrangements: Spouse/significant other   Type of Home: House Home Access: Stairs to enter Entrance Stairs-Rails: Psychiatric nurse of Steps: 4 Home Layout: One level Home Equipment: Bedside commode;Walker - 2 wheels      Prior Function Level of Independence: Independent               Hand Dominance        Extremity/Trunk Assessment   Upper Extremity Assessment: Overall WFL for tasks assessed           Lower Extremity Assessment: RLE deficits/detail RLE Deficits / Details: ankle WFL, knee and hip grossly 3/5       Communication   Communication: No difficulties  Cognition Arousal/Alertness: Awake/alert Behavior During Therapy: WFL for tasks assessed/performed Overall Cognitive Status: Within Functional Limits for tasks assessed                      General Comments      Exercises Total Joint Exercises Ankle Circles/Pumps: AROM;10 reps;Both Quad Sets: Both;10 reps;AROM;Strengthening      Assessment/Plan    PT Assessment Patient needs continued PT services  PT Diagnosis Difficulty walking   PT Problem List Decreased strength;Decreased range of motion;Decreased activity tolerance;Decreased mobility;Decreased knowledge of use of DME  PT Treatment Interventions DME instruction;Gait training;Functional mobility  training;Therapeutic activities;Therapeutic exercise;Patient/family education   PT Goals (Current goals can be found in the Care Plan section) Acute Rehab PT Goals Patient Stated Goal: lesspain, return to Independence PT Goal Formulation: With patient Time For Goal Achievement: 08/31/2015    Frequency 7X/week   Barriers to discharge        Co-evaluation               End of Session Equipment Utilized During Treatment: Gait belt Activity Tolerance: Patient  tolerated treatment well Patient left: with call bell/phone within reach;in chair;with chair alarm set;with family/visitor present           Time: [id number] PT Time Calculation (min) (ACUTE ONLY): 16 min   Charges:   PT Evaluation $PT Eval Low Complexity: 1 Procedure     PT G CodesKenyon Walton Aug 31, 2015, 6:36 PM

## 2015-08-04 NOTE — Anesthesia Postprocedure Evaluation (Signed)
Anesthesia Post Note  Patient: Amanda Walton  Procedure(s) Performed: Procedure(s) (LRB): RIGHT TOTAL HIP ARTHROPLASTY ANTERIOR APPROACH (Right)  Patient location during evaluation: PACU Anesthesia Type: Spinal Level of consciousness: oriented and awake and alert Pain management: pain level controlled Vital Signs Assessment: post-procedure vital signs reviewed and stable Respiratory status: spontaneous breathing, respiratory function stable and patient connected to nasal cannula oxygen Cardiovascular status: blood pressure returned to baseline and stable Postop Assessment: no headache, no backache and spinal receding Anesthetic complications: no    Last Vitals:  Filed Vitals:   08/04/15 1122 08/04/15 1136  BP: 110/57 99/44  Pulse: 61 62  Temp:  36.4 C  Resp: 12 16    Last Pain:  Filed Vitals:   08/04/15 1145  PainSc: 2                  Effie Berkshire

## 2015-08-04 NOTE — Op Note (Signed)
NAME:  Amanda Walton                ACCOUNT NO.: [id number]      MEDICAL RECORD NO.: [id number]      FACILITY:  [facility]      PHYSICIAN:  Paralee Cancel D  DATE OF BIRTH:  20-May-1945     DATE OF PROCEDURE:  08/04/2015                                 OPERATIVE REPORT         PREOPERATIVE DIAGNOSIS: Right  hip osteoarthritis.      POSTOPERATIVE DIAGNOSIS:  Right hip osteoarthritis.      PROCEDURE:  Right total hip replacement through an anterior approach   utilizing DePuy THR system, component size 42mm pinnacle cup, a size 36+4 neutral   Altrex liner, a size 4 standard Tri Lock stem with a 36+1.5 delta ceramic   ball.      SURGEON:  Pietro Cassis. Alvan Dame, M.D.      ASSISTANT:  Nehemiah Massed, PA-C      ANESTHESIA:  Spinal.      SPECIMENS:  None.      COMPLICATIONS:  None.      BLOOD LOSS:  400 cc     DRAINS:  None     INDICATION OF THE PROCEDURE:  Amanda Walton is a 70 y.o. female who had   presented to office for evaluation of right hip pain.  Radiographs revealed   progressive degenerative changes with bone-on-bone   articulation to the  hip joint.  The patient had painful limited range of   motion significantly affecting their overall quality of life.  The patient was failing to    respond to conservative measures, and at this point was ready   to proceed with more definitive measures.  The patient has noted progressive   degenerative changes in his hip, progressive problems and dysfunction   with regarding the hip prior to surgery.  Consent was obtained for   benefit of pain relief.  Specific risk of infection, DVT, component   failure, dislocation, need for revision surgery, as well discussion of   the anterior versus posterior approach were reviewed.  Consent was   obtained for benefit of anterior pain relief through an anterior   approach.      PROCEDURE IN DETAIL:  The patient was brought to operative theater.   Once adequate anesthesia,  preoperative antibiotics, 2gm of Ancef, 1 gm of Tranexamic Acid, and 10 mg of Decadron administered.   The patient was positioned supine on the OSI Hanna table.  Once adequate   padding of boney process was carried out, we had predraped out the hip, and  used fluoroscopy to confirm orientation of the pelvis and position.      The right hip was then prepped and draped from proximal iliac crest to   mid thigh with shower curtain technique.      Time-out was performed identifying the patient, planned procedure, and   extremity.     An incision was then made 2 cm distal and lateral to the   anterior superior iliac spine extending over the orientation of the   tensor fascia lata muscle and sharp dissection was carried down to the   fascia of the muscle and protractor placed in the soft tissues.      The fascia was then incised.  The muscle belly was identified and swept   laterally and retractor placed along the superior neck.  Following   cauterization of the circumflex vessels and removing some pericapsular   fat, a second cobra retractor was placed on the inferior neck.  A third   retractor was placed on the anterior acetabulum after elevating the   anterior rectus.  A L-capsulotomy was along the line of the   superior neck to the trochanteric fossa, then extended proximally and   distally.  Tag sutures were placed and the retractors were then placed   intracapsular.  We then identified the trochanteric fossa and   orientation of my neck cut, confirmed this radiographically   and then made a neck osteotomy with the femur on traction.  The femoral   head was removed without difficulty or complication.  Traction was let   off and retractors were placed posterior and anterior around the   acetabulum.      The labrum and foveal tissue were debrided.  I began reaming with a 14mm   reamer and reamed up to 20mm reamer with good bony bed preparation and a 7mm   cup was chosen.  The final 55mm  Pinnacle cup was then impacted under fluoroscopy  to confirm the depth of penetration and orientation with respect to   abduction.  A screw was placed followed by the hole eliminator.  The final   36+4 neutral Altrex liner was impacted with good visualized rim fit.  The cup was positioned anatomically within the acetabular portion of the pelvis.      At this point, the femur was rolled at 80 degrees.  Further capsule was   released off the inferior aspect of the femoral neck.  I then   released the superior capsule proximally.  The hook was placed laterally   along the femur and elevated manually and held in position with the bed   hook.  The leg was then extended and adducted with the leg rolled to 100   degrees of external rotation.  Once the proximal femur was fully   exposed, I used a box osteotome to set orientation.  I then began   broaching with the starting chili pepper broach and passed this by hand and then broached up to 4.  With the 4 broach in place I chose a standard offset neck and did a trial reduction.  The offset was appropriate, leg lengths   appeared to be equal, confirmed radiographically.   Given these findings, I went ahead and dislocated the hip, repositioned all   retractors and positioned the right hip in the extended and abducted position.  The final 4 Standard Tri Lock stem was   chosen and it was impacted down to the level of neck cut.  Based on this   and the trial reduction, a 36+1.5 delta ceramic ball was chosen and   impacted onto a clean and dry trunnion, and the hip was reduced.  The   hip had been irrigated throughout the case again at this point.  I did   reapproximate the superior capsular leaflet to the anterior leaflet   using #1 Vicryl.  The fascia of the   tensor fascia lata muscle was then reapproximated using #1 Vicryl and #0 V-lock sutures.  The   remaining wound was closed with 2-0 Vicryl and running 4-0 Monocryl.   The hip was cleaned, dried, and  dressed sterilely using Dermabond and   Aquacel dressing.  She was then brought   to recovery room in stable condition tolerating the procedure well.    Nehemiah Massed, PA-C was present for the entirety of the case involved from   preoperative positioning, perioperative retractor management, general   facilitation of the case, as well as primary wound closure as assistant.            Pietro Cassis Alvan Dame, M.D.        08/04/2015 10:04 AM

## 2015-08-04 NOTE — Anesthesia Procedure Notes (Signed)
Spinal Patient location during procedure: OR Start time: 08/04/2015 8:35 AM End time: 08/04/2015 8:40 AM Reason for block: at surgeon's request Staffing Resident/CRNA: Anne Fu Performed by: resident/CRNA  Preanesthetic Checklist Completed: patient identified, site marked, surgical consent, pre-op evaluation, timeout performed, IV checked, risks and benefits discussed, monitors and equipment checked and at surgeon's request Spinal Block Patient position: sitting Prep: Betadine Patient monitoring: heart rate, blood pressure and continuous pulse ox Approach: right paramedian Location: L2-3 Injection technique: single-shot Needle Needle type: Whitacre  Needle gauge: 25 G Needle length: 9 cm Assessment Sensory level: T6 Additional Notes Expiration date of kit checked and confirmed. Patient tolerated procedure well, without complications. X 1 attempt with noted clear CSF return. Loss of motor and sensory on exam post injection.

## 2015-08-05 ENCOUNTER — Encounter (HOSPITAL_COMMUNITY): Payer: Self-pay | Admitting: Orthopedic Surgery

## 2015-08-05 LAB — ABO/RH: ABO/RH(D): O NEG

## 2015-08-05 LAB — CBC
HCT: 29.2 % — ABNORMAL LOW (ref 36.0–46.0)
Hemoglobin: 9.7 g/dL — ABNORMAL LOW (ref 12.0–15.0)
MCH: 32.1 pg (ref 26.0–34.0)
MCHC: 33.2 g/dL (ref 30.0–36.0)
MCV: 96.7 fL (ref 78.0–100.0)
PLATELETS: 220 10*3/uL (ref 150–400)
RBC: 3.02 MIL/uL — AB (ref 3.87–5.11)
RDW: 13.9 % (ref 11.5–15.5)
WBC: 10.1 10*3/uL (ref 4.0–10.5)

## 2015-08-05 LAB — BASIC METABOLIC PANEL
ANION GAP: 6 (ref 5–15)
BUN: 27 mg/dL — ABNORMAL HIGH (ref 6–20)
CALCIUM: 8.4 mg/dL — AB (ref 8.9–10.3)
CO2: 24 mmol/L (ref 22–32)
Chloride: 111 mmol/L (ref 101–111)
Creatinine, Ser: 0.54 mg/dL (ref 0.44–1.00)
Glucose, Bld: 108 mg/dL — ABNORMAL HIGH (ref 65–99)
POTASSIUM: 4.3 mmol/L (ref 3.5–5.1)
Sodium: 141 mmol/L (ref 135–145)

## 2015-08-05 MED ORDER — FERROUS SULFATE 325 (65 FE) MG PO TABS
325.0000 mg | ORAL_TABLET | Freq: Three times a day (TID) | ORAL | Status: DC
Start: 2015-08-05 — End: 2016-03-26

## 2015-08-05 MED ORDER — HYDROCODONE-ACETAMINOPHEN 7.5-325 MG PO TABS: 1.0000 | ORAL_TABLET | ORAL | Status: DC | PRN

## 2015-08-05 MED ORDER — DOCUSATE SODIUM 100 MG PO CAPS: 100.0000 mg | ORAL_CAPSULE | Freq: Two times a day (BID) | ORAL | Status: DC

## 2015-08-05 MED ORDER — POLYETHYLENE GLYCOL 3350 17 G PO PACK: 17.0000 g | PACK | Freq: Two times a day (BID) | ORAL | Status: DC

## 2015-08-05 MED ORDER — ASPIRIN 325 MG PO TBEC: 325.0000 mg | DELAYED_RELEASE_TABLET | Freq: Two times a day (BID) | ORAL | Status: AC

## 2015-08-05 MED ORDER — TIZANIDINE HCL 4 MG PO TABS: 4.0000 mg | ORAL_TABLET | Freq: Four times a day (QID) | ORAL | Status: DC | PRN

## 2015-08-05 NOTE — Progress Notes (Signed)
Physical Therapy Treatment Patient Details Name: Amanda Walton MRN: [id number] DOB: 24-Aug-1945 Today's Date: 08/31/2015    History of Present Illness s/p right DA THA    PT Comments    Excellent progress  Follow Up Recommendations  Home health PT;Outpatient PT     Equipment Recommendations  None recommended by PT    Recommendations for Other Services       Precautions / Restrictions Precautions Precautions: Fall Restrictions Weight Bearing Restrictions: No Other Position/Activity Restrictions: WBAT    Mobility  Bed Mobility   Bed Mobility: Supine to Sit     Supine to sit: Supervision     General bed mobility comments: NT --in chair  Transfers Overall transfer level: Needs assistance Equipment used: Rolling walker (2 wheeled) Transfers: Sit to/from Stand Sit to Stand: Supervision;Modified independent (Device/Increase time)         General transfer comment: initial cues for hand placement and safety, pt self corrects end session  Ambulation/Gait Ambulation/Gait assistance: Min guard;Supervision Ambulation Distance (Feet): 60 Feet Assistive device: Rolling walker (2 wheeled) Gait Pattern/deviations: Step-to pattern;Step-through pattern     General Gait Details: cues for sequence, step length, RW position   Stairs Stairs: Yes Stairs assistance: Min guard Stair Management: One rail Right;One rail Left;Step to pattern;Sideways;Forwards Number of Stairs: 3 (x2) General stair comments: cues for sequence  Wheelchair Mobility    Modified Rankin (Stroke Patients Only)       Balance                                    Cognition Arousal/Alertness: Awake/alert Behavior During Therapy: WFL for tasks assessed/performed Overall Cognitive Status: Within Functional Limits for tasks assessed                      Exercises Total Joint Exercises Ankle Circles/Pumps: AROM;10 reps;Both Quad Sets: Both;10  reps;AROM;Strengthening Short Arc Quad: AROM;Strengthening;Right;10 reps Heel Slides: AROM;AAROM;Right;10 reps Hip ABduction/ADduction: AAROM;AROM;Right;10 reps    General Comments        Pertinent Vitals/Pain Pain Assessment: 0-10 Pain Score: 3  Pain Location: R hip Pain Descriptors / Indicators: Tightness Pain Intervention(s): Limited activity within patient's tolerance;Monitored during session;Premedicated before session;Ice applied    Home Living Family/patient expects to be discharged to:: Private residence Living Arrangements: Spouse/significant other Available Help at Discharge: Family         Home Equipment: Bedside commode;Walker - 2 wheels      Prior Function Level of Independence: Independent          PT Goals (current goals can now be found in the care plan section) Acute Rehab PT Goals Patient Stated Goal: return to being independent PT Goal Formulation: With patient Time For Goal Achievement: 08/04/15 Progress towards PT goals: Progressing toward goals    Frequency  7X/week    PT Plan Current plan remains appropriate    Co-evaluation             End of Session Equipment Utilized During Treatment: Gait belt Activity Tolerance: Patient tolerated treatment well Patient left: with call bell/phone within reach;in chair;with chair alarm set;with family/visitor present     Time: [id number] PT Time Calculation (min) (ACUTE ONLY): 18 min  Charges:  $Gait Training: 8-22 mins                    G Codes:      Kaleb Sek 2015-08-31, 12:30 PM

## 2015-08-05 NOTE — Care Management Note (Signed)
Case Management Note  Patient Details  Name: Amanda Walton MRN: [id number] Date of Birth: 1946-01-20  Subjective/Objective:                  RIGHT TOTAL HIP ARTHROPLASTY ANTERIOR APPROACH (Right)  Action/Plan: Discharge planning Expected Discharge Date:  08/05/15              Expected Discharge Plan:  [place]  In-House Referral:     Discharge planning Services  CM Consult  Post Acute Care Choice:  Home Health Choice offered to:  Patient  DME Arranged:  N/A DME Agency:  NA  HH Arranged:  PT HH Agency:  [facility]  Status of Service:  Completed, signed off  Medicare Important Message Given:    Date Medicare IM Given:    Medicare IM give by:    Date Additional Medicare IM Given:    Additional Medicare Important Message give by:     If discussed at [place] of Stay Meetings, dates discussed:    Additional Comments: CM met with pt in room to confirm pt has all DME needed; pt confirms.  Pt chooses [facility] to render HHPT.  Referral called to [employer], Tim.  No other CM needs were communicated. Dellie Catholic, RN 08/05/2015, 12:37 PM

## 2015-08-05 NOTE — Discharge Instructions (Signed)

## 2015-08-05 NOTE — Evaluation (Addendum)
Occupational Therapy Evaluation Patient Details Name: Amanda Walton MRN: [id number] DOB: November 15, 1945 Today's Date: 08/05/2015    History of Present Illness s/p right DA THA   Clinical Impression   This 71 year old female was admitted for the above surgery.  All education was completed. No further OT is needed at this time    Follow Up Recommendations  No OT follow up    Equipment Recommendations  Other (comment)    Recommendations for Other Services       Precautions / Restrictions Precautions Precautions: Fall Restrictions Weight Bearing Restrictions: No Other Position/Activity Restrictions: WBAT      Mobility Bed Mobility   Bed Mobility: Supine to Sit     Supine to sit: Supervision        Transfers   Equipment used: Rolling walker (2 wheeled) Transfers: Sit to/from Stand Sit to Stand: Supervision         General transfer comment: cues for UE placement    Balance                                            ADL Overall ADL's : Needs assistance/impaired                         Toilet Transfer: Supervision/safety;Ambulation;Comfort height toilet;RW       Tub/ Shower Transfer: Walk-in shower;Min guard;Ambulation     General ADL Comments: husband will assist with ADLs as needed. Pt needs min A overall for LB adls.  Practiced bathroom transfers, including high commode with only RW and toilet seat to use for support.  Educated husband on donning ted hose.  Husband has a h/o posterior THA and has AE but he will assist wife as needed     Vision     Perception     Praxis      Pertinent Vitals/Pain Pain Score: 2  Pain Location: R hip  Pain Descriptors / Indicators: Sore Pain Intervention(s): Limited activity within patient's tolerance;Monitored during session;Premedicated before session;Repositioned;Ice applied     Hand Dominance     Extremity/Trunk Assessment Upper Extremity Assessment Upper Extremity  Assessment: Overall WFL for tasks assessed           Communication Communication Communication: No difficulties   Cognition Arousal/Alertness: Awake/alert Behavior During Therapy: WFL for tasks assessed/performed Overall Cognitive Status: Within Functional Limits for tasks assessed                     General Comments       Exercises       Shoulder Instructions      Home Living Family/patient expects to be discharged to:: Private residence Living Arrangements: Spouse/significant other Available Help at Discharge: Family               Bathroom Shower/Tub: Door;Walk-in Psychologist, prison and probation services: Handicapped height     Home Equipment: Bedside commode;Walker - 2 wheels          Prior Functioning/Environment Level of Independence: Independent             OT Diagnosis: Generalized weakness   OT Problem List:     OT Treatment/Interventions:      OT Goals(Current goals can be found in the care plan section) Acute Rehab OT Goals Patient Stated Goal: return to being independent OT Goal Formulation: All assessment  and education complete, DC therapy  OT Frequency:     Barriers to D/C:            Co-evaluation              End of Session    Activity Tolerance: Patient tolerated treatment well Patient left: in chair;with call bell/phone within reach;with chair alarm set;with family/visitor present   Time: [id number] OT Time Calculation (min): 23 min Charges:  OT General Charges $OT Visit: 1 Procedure OT Evaluation $OT Eval Low Complexity: 1 Procedure G-Codes:    Jaliana Medellin 08-25-2015, 10:51 AM  Lesle Chris, OTR/L [phone] 2015-08-25

## 2015-08-05 NOTE — Progress Notes (Signed)
     Subjective: 1 Day Post-Op Procedure(s) (LRB): RIGHT TOTAL HIP ARTHROPLASTY ANTERIOR APPROACH (Right)   Seen by Dr. Alvan Dame. Patient reports pain as mild, pain controlled. No events throughout the night. Ready to be discharged home if she does well with PT.   Objective:   VITALS:   Filed Vitals:   08/05/15 0200 08/05/15 0459  BP: 90/52 103/80  Pulse: 62 66  Temp: 97.3 F (36.3 C) 98.2 F (36.8 C)  Resp: 14 16    Dorsiflexion/Plantar flexion intact Incision: dressing C/D/I No cellulitis present Compartment soft  LABS  Recent Labs  08/05/15 0412  HGB 9.7*  HCT 29.2*  WBC 10.1  PLT 220     Recent Labs  08/04/15 0645 08/05/15 0412  NA  --  141  K 3.9 4.3  BUN  --  27*  CREATININE  --  0.54  GLUCOSE  --  108*     Assessment/Plan: 1 Day Post-Op Procedure(s) (LRB): RIGHT TOTAL HIP ARTHROPLASTY ANTERIOR APPROACH (Right) Foley cath d/c'ed Advance diet Up with therapy D/C IV fluids Discharge home with home health  Follow up in 2 weeks at [facility] Follow up with OLIN,Aunesti Pellegrino D in 2 weeks.  Contact information:  [facility] [address], [address] Lucky Meziah Blasingame   PAC  08/05/2015, 9:27 AM

## 2015-08-05 NOTE — Progress Notes (Signed)
Pt discharged from the unit via wheelchair. Discharge instructions were reviewed with the pt and family member. Pt has no questions or concerns at this time.  Kearia Yin W Rejeana Fadness, RN

## 2015-08-11 NOTE — Discharge Summary (Signed)
Physician Discharge Summary  Patient ID: Amanda Walton MRN: [id number] DOB/AGE: [birth year] 70 y.o.  Admit date: 08/04/2015 Discharge date: 08/05/2015   Procedures:  Procedure(s) (LRB): RIGHT TOTAL HIP ARTHROPLASTY ANTERIOR APPROACH (Right)  Attending Physician:  Dr. Paralee Cancel   Admission Diagnoses:   Right hip primary OA / pain  Discharge Diagnoses:  Principal Problem:   S/P right THA, AA  Past Medical History  Diagnosis Date  . Arthritis     HPI:    Amanda Walton, 70 y.o. female, has a history of pain and functional disability in the right hip(s) due to arthritis and patient has failed non-surgical conservative treatments for greater than 12 weeks to include NSAID's and/or analgesics, use of assistive devices and activity modification. Onset of symptoms was gradual starting 1.5+ years ago with gradually worsening course since that time.The patient noted no past surgery on the right hip(s). Patient currently rates pain in the right hip at 8 out of 10 with activity. Patient has night pain, worsening of pain with activity and weight bearing, trendelenberg gait, pain that interfers with activities of daily living and pain with passive range of motion. Patient has evidence of periarticular osteophytes and joint space narrowing by imaging studies. This condition presents safety issues increasing the risk of falls. There is no current active infection. Risks, benefits and expectations were discussed with the patient. Risks including but not limited to the risk of anesthesia, blood clots, nerve damage, blood vessel damage, failure of the prosthesis, infection and up to and including death. Patient understand the risks, benefits and expectations and wishes to proceed with surgery.    PCP: Margarita Rana, MD   Discharged Condition: good  Hospital Course:  Patient underwent the above stated procedure on 08/04/2015. Patient tolerated the procedure well and brought to the recovery  room in good condition and subsequently to the floor.  POD #1 BP: 103/80 ; Pulse: 66 ; Temp: 98.2 F (36.8 C) ; Resp: 16 Patient reports pain as mild, pain controlled. No events throughout the night. Ready to be discharged home. Dorsiflexion/plantar flexion intact, incision: dressing C/D/I, no cellulitis present and compartment soft.   LABS  Basename    HGB     9.7  HCT     29.2    Discharge Exam: General appearance: alert, cooperative and no distress Extremities: Homans sign is negative, no sign of DVT, no edema, redness or tenderness in the calves or thighs and no ulcers, gangrene or trophic changes  Disposition: Home with follow up in 2 weeks   Follow-up Information    Follow up with Mauri Pole, MD. Schedule an appointment as soon as possible for a visit in 2 weeks.   Specialty:  Orthopedic Surgery   Contact information:   [address] [place] [address] [phone]       Follow up with [facility].   Why:  home health physical therapy   Contact information:   [facility] [place] [address] [phone]       Discharge Instructions    Call MD / Call 911    Complete by:  As directed   If you experience chest pain or shortness of breath, CALL 911 and be transported to the hospital emergency room.  If you develope a fever above 101 F, pus (white drainage) or increased drainage or redness at the wound, or calf pain, call your surgeon's office.     Change dressing    Complete by:  As directed   Maintain surgical dressing until follow up in the clinic. If the edges start to pull up, may reinforce with tape. If the dressing is no longer working, may remove and cover with gauze and tape, but must keep the area dry and clean.  Call with any questions or concerns.     Constipation Prevention    Complete by:  As directed   Drink plenty of fluids.  Prune juice may be helpful.  You may use a stool softener, such as Colace (over  the counter) 100 mg twice a day.  Use MiraLax (over the counter) for constipation as needed.     Diet - low sodium heart healthy    Complete by:  As directed      Discharge instructions    Complete by:  As directed   Maintain surgical dressing until follow up in the clinic. If the edges start to pull up, may reinforce with tape. If the dressing is no longer working, may remove and cover with gauze and tape, but must keep the area dry and clean.  Follow up in 2 weeks at [facility]. Call with any questions or concerns.     Increase activity slowly as tolerated    Complete by:  As directed   Weight bearing as tolerated with assist device (walker, cane, etc) as directed, use it as long as suggested by your surgeon or therapist, typically at least 4-6 weeks.     TED hose    Complete by:  As directed   Use stockings (TED hose) for 2 weeks on both leg(s).  You may remove them at night for sleeping.             Medication List    STOP taking these medications        meloxicam 15 MG tablet  Commonly known as:  MOBIC      TAKE these medications        aspirin 325 MG EC tablet  Take 1 tablet (325 mg total) by mouth 2 (two) times daily.     CALCIUM CARBONATE-VITAMIN D PO  Take 2 tablets by mouth daily.     Cranberry 250 MG Caps  Take 1 capsule by mouth daily.     docusate sodium 100 MG capsule  Commonly known as:  COLACE  Take 1 capsule (100 mg total) by mouth 2 (two) times daily.     ferrous sulfate 325 (65 FE) MG tablet  Take 1 tablet (325 mg total) by mouth 3 (three) times daily after meals.     FIBER COMPLETE PO  Take 2 capsules by mouth daily.     glucosamine-chondroitin 500-400 MG tablet  Take 1 tablet by mouth 3 (three) times daily with meals.     HYDROcodone-acetaminophen 7.5-325 MG tablet  Commonly known as:  NORCO  Take 1-2 tablets by mouth every 4 (four) hours as needed for moderate pain.     hydroxypropyl methylcellulose / hypromellose 2.5 %  ophthalmic solution  Commonly known as:  ISOPTO TEARS / GONIOVISC  Place 1 drop into both eyes 3 (three) times daily as needed for dry eyes.     nitrofurantoin (macrocrystal-monohydrate) 100 MG capsule  Commonly known as:  MACROBID  Take 1 capsule (100 mg total) by mouth 2 (two) times daily.     polyethylene glycol packet  Commonly known as:  MIRALAX / GLYCOLAX  Take 17 g by mouth 2 (two) times daily.     simvastatin 10 MG  tablet  Commonly known as:  ZOCOR  Take 1 tablet (10 mg total) by mouth daily.     tiZANidine 4 MG tablet  Commonly known as:  ZANAFLEX  Take 1 tablet (4 mg total) by mouth every 6 (six) hours as needed for muscle spasms.         Signed: [place] Pugh. Mahlon Gabrielle   PA-C  08/11/2015, 3:14 PM

## 2015-08-26 ENCOUNTER — Other Ambulatory Visit: Payer: Self-pay | Admitting: Family Medicine

## 2015-09-02 DIAGNOSIS — D2271 Melanocytic nevi of right lower limb, including hip: Secondary | ICD-10-CM | POA: Diagnosis not present

## 2015-09-02 DIAGNOSIS — D2262 Melanocytic nevi of left upper limb, including shoulder: Secondary | ICD-10-CM | POA: Diagnosis not present

## 2015-09-02 DIAGNOSIS — L814 Other melanin hyperpigmentation: Secondary | ICD-10-CM | POA: Diagnosis not present

## 2015-09-02 DIAGNOSIS — D225 Melanocytic nevi of trunk: Secondary | ICD-10-CM | POA: Diagnosis not present

## 2015-09-18 DIAGNOSIS — Z471 Aftercare following joint replacement surgery: Secondary | ICD-10-CM | POA: Diagnosis not present

## 2015-09-18 DIAGNOSIS — Z96641 Presence of right artificial hip joint: Secondary | ICD-10-CM | POA: Diagnosis not present

## 2015-12-23 DIAGNOSIS — H40153 Residual stage of open-angle glaucoma, bilateral: Secondary | ICD-10-CM | POA: Diagnosis not present

## 2016-01-25 ENCOUNTER — Other Ambulatory Visit: Payer: Self-pay | Admitting: Family Medicine

## 2016-01-25 DIAGNOSIS — E7800 Pure hypercholesterolemia, unspecified: Secondary | ICD-10-CM

## 2016-03-26 ENCOUNTER — Encounter: Payer: Self-pay | Admitting: Emergency Medicine

## 2016-03-26 ENCOUNTER — Ambulatory Visit
Admission: EM | Admit: 2016-03-26 | Discharge: 2016-03-26 | Disposition: A | Discharge status: Home / Self Care | Payer: PPO | Attending: Internal Medicine | Admitting: Internal Medicine

## 2016-03-26 DIAGNOSIS — J020 Streptococcal pharyngitis: Secondary | ICD-10-CM

## 2016-03-26 LAB — RAPID INFLUENZA A&B ANTIGENS
Influenza A (ARMC): NEGATIVE
Influenza B (ARMC): NEGATIVE

## 2016-03-26 LAB — RAPID STREP SCREEN (MED CTR MEBANE ONLY): Streptococcus, Group A Screen (Direct): NEGATIVE

## 2016-03-26 MED ORDER — PENICILLIN V POTASSIUM 500 MG PO TABS: 500.0000 mg | ORAL_TABLET | Freq: Three times a day (TID) | ORAL | 0 refills | Status: DC

## 2016-03-26 NOTE — ED Provider Notes (Signed)
[facility]    CSN: [id number] Arrival date & time: 03/26/16  0940     History   Chief Complaint Chief Complaint  Patient presents with  . Sore Throat  . Generalized Body Aches  . Fever    HPI Amanda Walton is a 70 y.o. female.   Sore throat and bodyaches x3 days. Fever to 101.5 F yesterday.  Grandson positive for strep throat. No chronic medical illnesses      Past Medical History:  Diagnosis Date  . Arthritis     Patient Active Problem List   Diagnosis Date Noted  . S/P right THA, AA 08/04/2015  . Serum potassium elevated 07/08/2015  . Arthritis 05/20/2015  . Blood in the urine 05/20/2015  . Hypercholesteremia 01/21/2009  . Adult hypothyroidism 07/26/2007  . OP (osteoporosis) 07/13/2006  . Displacement of lumbar intervertebral disc without myelopathy 11/13/2004    Past Surgical History:  Procedure Laterality Date  . TONSILLECTOMY AND ADENOIDECTOMY    . TOTAL HIP ARTHROPLASTY Right 08/04/2015   Procedure: RIGHT TOTAL HIP ARTHROPLASTY ANTERIOR APPROACH;  Surgeon: Paralee Cancel, MD;  Location: [facility];  Service: Orthopedics;  Laterality: Right;  . TUBAL LIGATION      OB History    No data available       Home Medications    Prior to Admission medications   Medication Sig Start Date End Date Taking? Authorizing Provider  CALCIUM CARBONATE-VITAMIN D PO Take 2 tablets by mouth daily.  07/13/06   Historical Provider, MD  Cranberry 250 MG CAPS Take 1 capsule by mouth daily.     Historical Provider, MD  FIBER COMPLETE PO Take 2 capsules by mouth daily.  07/13/06   Historical Provider, MD  penicillin v potassium (VEETID) 500 MG tablet Take 1 tablet (500 mg total) by mouth 3 (three) times daily. 03/26/16   Harrie Foreman, MD  simvastatin (ZOCOR) 10 MG tablet TAKE ONE TABLET AT BEDTIME 01/25/16   Birdie Sons, MD    Family History Family History  Problem Relation Age of Onset  . Dementia Mother   . Hypertension Father   . Heart disease  Father   . Hyperlipidemia Sister   . Pancreatic cancer Sister     Social History Social History  Substance Use Topics  . Smoking status: Former Smoker    Quit date: 04/10/1989  . Smokeless tobacco: Never Used  . Alcohol use 4.2 - 4.8 oz/week    7 - 8 Glasses of wine per week     Allergies   Patient has no known allergies.   Review of Systems Review of Systems  Constitutional: Positive for fatigue and fever.  HENT: Positive for sore throat.   Respiratory: Positive for cough. Negative for shortness of breath.   Cardiovascular: Negative for chest pain.  Gastrointestinal: Negative for diarrhea, nausea and vomiting.  Skin: Negative for rash.     Physical Exam Triage Vital Signs ED Triage Vitals  Enc Vitals Group     BP 03/26/16 0952 135/65     Pulse Rate 03/26/16 0952 88     Resp 03/26/16 0952 16     Temp 03/26/16 0952 99.5 F (37.5 C)     Temp Source 03/26/16 0952 Oral     SpO2 03/26/16 0952 96 %     Weight 03/26/16 0951 130 lb (59 kg)     Height 03/26/16 0951 5\' 3"  (1.6 m)     Head Circumference --      Peak Flow --  Pain Score 03/26/16 0954 4     Pain Loc --      Pain Edu? --      Excl. in [place]? --    No data found.   Updated Vital Signs BP 135/65 (BP Location: Left Arm)   Pulse 88   Temp 99.5 F (37.5 C) (Oral)   Resp 16   Ht 5\' 3"  (1.6 m)   Wt 130 lb (59 kg)   SpO2 96%   BMI 23.03 kg/m   Visual Acuity Right Eye Distance:   Left Eye Distance:   Bilateral Distance:    Right Eye Near:   Left Eye Near:    Bilateral Near:     Physical Exam  Constitutional: She is oriented to person, place, and time. She appears well-developed and well-nourished. No distress.  HENT:  Head: Normocephalic and atraumatic.  Mouth/Throat: Oropharyngeal exudate present.  Eyes: Conjunctivae and EOM are normal. Pupils are equal, round, and reactive to light. No scleral icterus.  Neck: Normal range of motion. Neck supple. No JVD present. No tracheal deviation  present. No thyromegaly present.  Cardiovascular: Normal rate, regular rhythm and normal heart sounds.  Exam reveals no gallop and no friction rub.   No murmur heard. Pulmonary/Chest: Effort normal and breath sounds normal.  Abdominal: Soft. Bowel sounds are normal. She exhibits no distension. There is no tenderness.  Musculoskeletal: Normal range of motion. She exhibits no edema.  Lymphadenopathy:    She has no cervical adenopathy.  Neurological: She is alert and oriented to person, place, and time. No cranial nerve deficit.  Skin: Skin is warm and dry.  Psychiatric: She has a normal mood and affect. Her behavior is normal. Judgment and thought content normal.  Nursing note and vitals reviewed.    UC Treatments / Results  Labs (all labs ordered are listed, but only abnormal results are displayed) Labs Reviewed  RAPID INFLUENZA A&B ANTIGENS ([facility] ONLY)  RAPID STREP SCREEN (NOT AT [facility])    EKG  EKG Interpretation None       Radiology No results found.  Procedures Procedures (including critical care time)  Medications Ordered in UC Medications - No data to display   Initial Impression / Assessment and Plan / UC Course  I have reviewed the triage vital signs and the nursing notes.  Pertinent labs & imaging results that were available during my care of the patient were reviewed by me and considered in my medical decision making (see chart for details).  Clinical Course     Clinically strep throat with positive exposure.  Empiric treatment.  Final Clinical Impressions(s) / UC Diagnoses   Final diagnoses:  Strep pharyngitis    New Prescriptions New Prescriptions   PENICILLIN V POTASSIUM (VEETID) 500 MG TABLET    Take 1 tablet (500 mg total) by mouth 3 (three) times daily.     Harrie Foreman, MD 03/26/16 1009

## 2016-03-26 NOTE — ED Triage Notes (Signed)
Patient c/o sore throat, bodyaches and fever that started on Thursday.

## 2016-03-29 LAB — CULTURE, GROUP A STREP (THRC)

## 2016-04-12 ENCOUNTER — Ambulatory Visit (INDEPENDENT_AMBULATORY_CARE_PROVIDER_SITE_OTHER): Payer: PPO

## 2016-04-12 ENCOUNTER — Ambulatory Visit
Admission: EM | Admit: 2016-04-12 | Discharge: 2016-04-12 | Disposition: A | Discharge status: Home / Self Care | Payer: PPO | Attending: Family Medicine | Admitting: Family Medicine

## 2016-04-12 DIAGNOSIS — J441 Chronic obstructive pulmonary disease with (acute) exacerbation: Secondary | ICD-10-CM

## 2016-04-12 DIAGNOSIS — R05 Cough: Secondary | ICD-10-CM | POA: Diagnosis not present

## 2016-04-12 MED ORDER — DOXYCYCLINE HYCLATE 100 MG PO CAPS: 100.0000 mg | ORAL_CAPSULE | Freq: Two times a day (BID) | ORAL | 0 refills | Status: DC

## 2016-04-12 MED ORDER — ALBUTEROL SULFATE HFA 108 (90 BASE) MCG/ACT IN AERS: 1.0000 | INHALATION_SPRAY | Freq: Four times a day (QID) | RESPIRATORY_TRACT | 0 refills | Status: DC | PRN

## 2016-04-12 MED ORDER — PREDNISONE 20 MG PO TABS: ORAL_TABLET | ORAL | 0 refills | Status: DC

## 2016-04-12 NOTE — ED Triage Notes (Signed)
Pt coughing that she cant get rid of and has tried OTC meds and it hasnt help any.

## 2016-04-12 NOTE — ED Provider Notes (Signed)
CSN: [id number]     Arrival date & time 04/12/16  1204 History   First MD Initiated Contact with Patient 04/12/16 1408     Chief Complaint  Patient presents with  . Cough   (Consider location/radiation/quality/duration/timing/severity/associated sxs/prior Treatment) HPI      This a 71 year old female who presents with coughing that she has had for over a week but worse and more severely yesterday very rapidly and suddenly. As a small amount of production. She has had a fever and her temperature is 100F at Center 97% on room air respirations 18 and blood pressure 154/75. Tried numerous over-the-counter remedies which have not been successful. Is a previous history of smoking but has not smoked since 1990. She was seen here on 03/26/2016 for pharyngitis which was treated with penicillin and that resolved. Shortly afterwards however she began to have cold symptoms which has developed into her cough that she presently has.       Past Medical History:  Diagnosis Date  . Arthritis    Past Surgical History:  Procedure Laterality Date  . TONSILLECTOMY AND ADENOIDECTOMY    . TOTAL HIP ARTHROPLASTY Right 08/04/2015   Procedure: RIGHT TOTAL HIP ARTHROPLASTY ANTERIOR APPROACH;  Surgeon: Paralee Cancel, MD;  Location: [facility];  Service: Orthopedics;  Laterality: Right;  . TUBAL LIGATION     Family History  Problem Relation Age of Onset  . Dementia Mother   . Hypertension Father   . Heart disease Father   . Hyperlipidemia Sister   . Pancreatic cancer Sister    Social History  Substance Use Topics  . Smoking status: Former Smoker    Quit date: 04/10/1989  . Smokeless tobacco: Never Used  . Alcohol use 4.2 - 4.8 oz/week    7 - 8 Glasses of wine per week   OB History    No data available     Review of Systems  Constitutional: Positive for activity change, fatigue and fever. Negative for chills.  HENT: Positive for congestion, postnasal drip and sneezing.   Respiratory: Positive for cough  and shortness of breath. Negative for wheezing and stridor.   All other systems reviewed and are negative.   Allergies  Patient has no known allergies.  Home Medications   Prior to Admission medications   Medication Sig Start Date End Date Taking? Authorizing Provider  CALCIUM CARBONATE-VITAMIN D PO Take 2 tablets by mouth daily.  07/13/06  Yes Historical Provider, MD  Cranberry 250 MG CAPS Take 1 capsule by mouth daily.    Yes Historical Provider, MD  FIBER COMPLETE PO Take 2 capsules by mouth daily.  07/13/06  Yes Historical Provider, MD  penicillin v potassium (VEETID) 500 MG tablet Take 1 tablet (500 mg total) by mouth 3 (three) times daily. 03/26/16  Yes Harrie Foreman, MD  simvastatin (ZOCOR) 10 MG tablet TAKE ONE TABLET AT BEDTIME 01/25/16  Yes Birdie Sons, MD  albuterol (PROVENTIL HFA;VENTOLIN HFA) 108 (90 Base) MCG/ACT inhaler Inhale 1-2 puffs into the lungs every 6 (six) hours as needed for wheezing or shortness of breath. Use with spacer 04/12/16   Lorin Picket, PA-C  doxycycline (VIBRAMYCIN) 100 MG capsule Take 1 capsule (100 mg total) by mouth 2 (two) times daily. 04/12/16   Lorin Picket, PA-C  predniSONE (DELTASONE) 20 MG tablet Take 2 tablets (40 mg) daily by mouth 04/12/16   Lorin Picket, PA-C   Meds Ordered and Administered this Visit  Medications - No data to display  BP (!) 154/75 (BP Location: Left Arm)   Pulse 88   Temp 100 F (37.8 C) (Oral)   Resp 18   Ht 5\' 3"  (1.6 m)   Wt 130 lb (59 kg)   SpO2 97%   BMI 23.03 kg/m  No data found.   Physical Exam  Constitutional: She is oriented to person, place, and time. She appears well-developed and well-nourished. No distress.  HENT:  Head: Normocephalic and atraumatic.  Nose: Nose normal.  Mouth/Throat: Oropharynx is clear and moist.  Both ears are impacted with cerumen  Eyes: EOM are normal. Pupils are equal, round, and reactive to light. Right eye exhibits no discharge. Left eye exhibits no  discharge.  Neck: Normal range of motion. Neck supple.  Pulmonary/Chest: Effort normal. No respiratory distress. She has no wheezes. She has rales.  Patient has non-tussive crackles in the left base  Musculoskeletal: Normal range of motion.  Lymphadenopathy:    She has no cervical adenopathy.  Neurological: She is alert and oriented to person, place, and time.  Skin: Skin is warm and dry. She is not diaphoretic.  Psychiatric: She has a normal mood and affect. Her behavior is normal. Judgment and thought content normal.  Nursing note and vitals reviewed.   Urgent Care Course   Clinical Course     Procedures (including critical care time)  Labs Review Labs Reviewed - No data to display  Imaging Review Dg Chest 2 View  Result Date: 04/12/2016 CLINICAL DATA:  Cough since 04/04/2016, refractory to over the counter medications. EXAM: CHEST  2 VIEW COMPARISON:  None. FINDINGS: Heart size and mediastinal contours are normal. Lungs are clear. Lung volumes are least slightly increased suggesting COPD. No pleural effusion or pneumothorax seen. Osseous structures about the chest are unremarkable. IMPRESSION: 1. No acute findings.  No evidence of pneumonia or pulmonary edema. 2. Lungs at least mildly hyperexpanded suggesting COPD. Electronically Signed   By: Franki Cabot M.D.   On: 04/12/2016 14:38     Visual Acuity Review  Right Eye Distance:   Left Eye Distance:   Bilateral Distance:    Right Eye Near:   Left Eye Near:    Bilateral Near:         MDM   1. COPD exacerbation ([place])   Possible needs further evaluation for diagnosis.  Discharge Medication List as of 04/12/2016  3:22 PM    START taking these medications   Details  albuterol (PROVENTIL HFA;VENTOLIN HFA) 108 (90 Base) MCG/ACT inhaler Inhale 1-2 puffs into the lungs every 6 (six) hours as needed for wheezing or shortness of breath. Use with spacer, Starting Tue 04/12/2016, Normal    doxycycline (VIBRAMYCIN) 100 MG  capsule Take 1 capsule (100 mg total) by mouth 2 (two) times daily., Starting Tue 04/12/2016, Normal    predniSONE (DELTASONE) 20 MG tablet Take 2 tablets (40 mg) daily by mouth, Normal      Plan: 1. Test/x-ray results and diagnosis reviewed with patient 2. rx as per orders; risks, benefits, potential side effects reviewed with patient 3. Recommend supportive treatment with Use of albuterol as necessary. Follow-up with primary care physician for definitive diagnosis and treatment. 4. F/u prn if symptoms worsen or don't improve      Lorin Picket, PA-C 04/12/16 1527

## 2016-04-20 DIAGNOSIS — H40153 Residual stage of open-angle glaucoma, bilateral: Secondary | ICD-10-CM | POA: Diagnosis not present

## 2016-05-11 ENCOUNTER — Encounter: Payer: Self-pay | Admitting: Physician Assistant

## 2016-05-11 ENCOUNTER — Ambulatory Visit (INDEPENDENT_AMBULATORY_CARE_PROVIDER_SITE_OTHER): Payer: PPO | Admitting: Physician Assistant

## 2016-05-11 VITALS — BP 142/82 | HR 76 | Temp 99.0°F | Resp 16 | Wt 136.0 lb

## 2016-05-11 DIAGNOSIS — Z87891 Personal history of nicotine dependence: Secondary | ICD-10-CM | POA: Diagnosis not present

## 2016-05-11 DIAGNOSIS — R030 Elevated blood-pressure reading, without diagnosis of hypertension: Secondary | ICD-10-CM

## 2016-05-11 DIAGNOSIS — N3090 Cystitis, unspecified without hematuria: Secondary | ICD-10-CM

## 2016-05-11 LAB — POCT URINALYSIS DIPSTICK
Bilirubin, UA: NEGATIVE
Glucose, UA: NEGATIVE
Ketones, UA: NEGATIVE
Protein, UA: NEGATIVE
Spec Grav, UA: 1.02
Urobilinogen, UA: 0.2
pH, UA: 6

## 2016-05-11 MED ORDER — NITROFURANTOIN MONOHYD MACRO 100 MG PO CAPS: 100.0000 mg | ORAL_CAPSULE | Freq: Two times a day (BID) | ORAL | 0 refills | Status: AC

## 2016-05-11 NOTE — Progress Notes (Addendum)
Patient: Amanda Walton Female    DOB: Feb 16, 1946   71 y.o.   MRN: [id number] Visit Date: 05/11/2016  Today's Provider: Trinna Post, PA-C   Chief Complaint  Patient presents with  . Hypertension    Pt reports her blood pressure has been "creeping up" lately.   Subjective:    Hypertension  This is a new problem. The problem is unchanged. The problem is uncontrolled. Pertinent negatives include no anxiety, blurred vision, headaches, malaise/fatigue, palpitations, peripheral edema, shortness of breath or sweats. There are no known risk factors for coronary artery disease.  Urinary Tract Infection   This is a new problem. The current episode started in the past 7 days. The problem has been unchanged. There has been no fever. Associated symptoms include frequency. Pertinent negatives include no chills, discharge, flank pain, hematuria, hesitancy, sweats or urgency.   Patient also asking about possible COPD as mentioned to her at recent urgent care visit. She denies coughing, SOB, wheezing. 10 pack year smoking hx.    No Known Allergies   Current Outpatient Prescriptions:  .  CALCIUM CARBONATE-VITAMIN D PO, Take 2 tablets by mouth daily. , Disp: , Rfl:  .  FIBER COMPLETE PO, Take 2 capsules by mouth daily. , Disp: , Rfl:  .  simvastatin (ZOCOR) 10 MG tablet, TAKE ONE TABLET AT BEDTIME, Disp: 30 tablet, Rfl: 5  Review of Systems  Constitutional: Negative for activity change, appetite change, chills, diaphoresis, fatigue, fever, malaise/fatigue and unexpected weight change.  Eyes: Negative for blurred vision.  Respiratory: Negative.  Negative for shortness of breath.   Cardiovascular: Negative.  Negative for palpitations.  Gastrointestinal: Negative.   Genitourinary: Positive for dysuria and frequency. Negative for decreased urine volume, difficulty urinating, dyspareunia, enuresis, flank pain, hematuria, hesitancy, pelvic pain, urgency, vaginal bleeding, vaginal discharge  and vaginal pain.  Musculoskeletal: Negative for back pain.  Neurological: Negative for dizziness, light-headedness and headaches.    Social History  Substance Use Topics  . Smoking status: Former Smoker    Quit date: 04/10/1989  . Smokeless tobacco: Never Used  . Alcohol use 4.2 - 4.8 oz/week    7 - 8 Glasses of wine per week   Objective:   BP (!) 142/82 (BP Location: Right Arm, Patient Position: Sitting, Cuff Size: Normal)   Pulse 76   Temp 99 F (37.2 C) (Oral)   Resp 16   Wt 136 lb (61.7 kg)   BMI 24.09 kg/m   Physical Exam  Constitutional: She is oriented to person, place, and time. She appears well-developed and well-nourished. No distress.  Eyes: Conjunctivae and EOM are normal.  Neck: Neck supple.  Cardiovascular: Normal rate, regular rhythm and intact distal pulses.  Exam reveals no gallop and no friction rub.   No murmur heard. Pulmonary/Chest: Effort normal and breath sounds normal.  Abdominal: Soft. Bowel sounds are normal. She exhibits no distension. There is no tenderness. There is no rebound, no guarding and no CVA tenderness.  Lymphadenopathy:    She has no cervical adenopathy.  Neurological: She is alert and oriented to person, place, and time.  Skin: Skin is warm and dry. She is not diaphoretic.  Psychiatric: She has a normal mood and affect. Her behavior is normal.        Assessment & Plan:     1. Cystitis  Eval and treat as below. Patient reports her gynecologist at one point told her she had a bladder prolapse and wonders if this could  be the cause of her UTIs, as she reports she normally gets them often. This is possible, offered urology referral which patient will think about and readdress at CPE.  - nitrofurantoin, macrocrystal-monohydrate, (MACROBID) 100 MG capsule; Take 1 capsule (100 mg total) by mouth 2 (two) times daily.  Dispense: 10 capsule; Refill: 0 - POCT urinalysis dipstick - Urine Culture  2. Elevated blood pressure  reading  Patient has had elevated BP on past visits in instances of pain. Will recheck at physical and if remains high, will discuss anti-HTN medications.  3. History of cigarette smoking  Reviewed xray from urgent care which revealed mildly hyperinflated lungs consistent with COPD. Patient does have 10 pack year smoking history and we discussed possible workup and interventions if patient were symptomatic. However, patient reports she is not symptomatic and would like to defer this.  Return if symptoms worsen or fail to improve.   Patient Instructions  Urinary Tract Infection, Adult A urinary tract infection (UTI) is an infection of any part of the urinary tract, which includes the kidneys, ureters, bladder, and urethra. These organs make, store, and get rid of urine in the body. UTI can be a bladder infection (cystitis) or kidney infection (pyelonephritis). What are the causes? This infection may be caused by fungi, viruses, or bacteria. Bacteria are the most common cause of UTIs. This condition can also be caused by repeated incomplete emptying of the bladder during urination. What increases the risk? This condition is more likely to develop if:  You ignore your need to urinate or hold urine for long periods of time.  You do not empty your bladder completely during urination.  You wipe back to front after urinating or having a bowel movement, if you are female.  You are uncircumcised, if you are female.  You are constipated.  You have a urinary catheter that stays in place (indwelling).  You have a weak defense (immune) system.  You have a medical condition that affects your bowels, kidneys, or bladder.  You have diabetes.  You take antibiotic medicines frequently or for long periods of time, and the antibiotics no longer work well against certain types of infections (antibiotic resistance).  You take medicines that irritate your urinary tract.  You are exposed to chemicals  that irritate your urinary tract.  You are female. What are the signs or symptoms? Symptoms of this condition include:  Fever.  Frequent urination or passing small amounts of urine frequently.  Needing to urinate urgently.  Pain or burning with urination.  Urine that smells bad or unusual.  Cloudy urine.  Pain in the lower abdomen or back.  Trouble urinating.  Blood in the urine.  Vomiting or being less hungry than normal.  Diarrhea or abdominal pain.  Vaginal discharge, if you are female. How is this diagnosed? This condition is diagnosed with a medical history and physical exam. You will also need to provide a urine sample to test your urine. Other tests may be done, including:  Blood tests.  Sexually transmitted disease (STD) testing. If you have had more than one UTI, a cystoscopy or imaging studies may be done to determine the cause of the infections. How is this treated? Treatment for this condition often includes a combination of two or more of the following:  Antibiotic medicine.  Other medicines to treat less common causes of UTI.  Over-the-counter medicines to treat pain.  Drinking enough water to stay hydrated. Follow these instructions at home:  Take over-the-counter and  prescription medicines only as told by your health care provider.  If you were prescribed an antibiotic, take it as told by your health care provider. Do not stop taking the antibiotic even if you start to feel better.  Avoid alcohol, caffeine, tea, and carbonated beverages. They can irritate your bladder.  Drink enough fluid to keep your urine clear or pale yellow.  Keep all follow-up visits as told by your health care provider. This is important.  Make sure to:  Empty your bladder often and completely. Do not hold urine for long periods of time.  Empty your bladder before and after sex.  Wipe from front to back after a bowel movement if you are female. Use each tissue one  time when you wipe. Contact a health care provider if:  You have back pain.  You have a fever.  You feel nauseous or vomit.  Your symptoms do not get better after 3 days.  Your symptoms go away and then return. Get help right away if:  You have severe back pain or lower abdominal pain.  You are vomiting and cannot keep down any medicines or water. This information is not intended to replace advice given to you by your health care provider. Make sure you discuss any questions you have with your health care provider. Document Released: 01/05/2005 Document Revised: 09/09/2015 Document Reviewed: 02/16/2015 Elsevier Interactive Patient Education  2017 [employer].   The entirety of the information documented in the History of Present Illness, Review of Systems and Physical Exam were personally obtained by me. Portions of this information were initially documented by Ashley Royalty, CMA and reviewed by me for thoroughness and accuracy.           Trinna Post, PA-C  [facility]

## 2016-05-11 NOTE — Patient Instructions (Signed)

## 2016-05-13 LAB — URINE CULTURE

## 2016-07-06 ENCOUNTER — Encounter: Payer: Self-pay | Admitting: Physician Assistant

## 2016-07-06 ENCOUNTER — Ambulatory Visit (INDEPENDENT_AMBULATORY_CARE_PROVIDER_SITE_OTHER): Payer: PPO | Admitting: Physician Assistant

## 2016-07-06 VITALS — BP 132/78 | HR 68 | Temp 98.8°F | Resp 16 | Ht 62.5 in | Wt 135.0 lb

## 2016-07-06 DIAGNOSIS — Z1231 Encounter for screening mammogram for malignant neoplasm of breast: Secondary | ICD-10-CM | POA: Diagnosis not present

## 2016-07-06 DIAGNOSIS — Z0000 Encounter for general adult medical examination without abnormal findings: Secondary | ICD-10-CM | POA: Diagnosis not present

## 2016-07-06 DIAGNOSIS — E7800 Pure hypercholesterolemia, unspecified: Secondary | ICD-10-CM

## 2016-07-06 DIAGNOSIS — E875 Hyperkalemia: Secondary | ICD-10-CM

## 2016-07-06 DIAGNOSIS — M8580 Other specified disorders of bone density and structure, unspecified site: Secondary | ICD-10-CM

## 2016-07-06 DIAGNOSIS — Z1239 Encounter for other screening for malignant neoplasm of breast: Secondary | ICD-10-CM

## 2016-07-06 DIAGNOSIS — E039 Hypothyroidism, unspecified: Secondary | ICD-10-CM | POA: Diagnosis not present

## 2016-07-06 MED ORDER — SIMVASTATIN 10 MG PO TABS: 10.0000 mg | ORAL_TABLET | Freq: Every day | ORAL | 5 refills | Status: DC

## 2016-07-06 NOTE — Patient Instructions (Signed)
Health Maintenance, Female Adopting a healthy lifestyle and getting preventive care can go a long way to promote health and wellness. Talk with your health care provider about what schedule of regular examinations is right for you. This is a good chance for you to check in with your provider about disease prevention and staying healthy. In between checkups, there are plenty of things you can do on your own. Experts have done a lot of research about which lifestyle changes and preventive measures are most likely to keep you healthy. Ask your health care provider for more information. Weight and diet Eat a healthy diet  Be sure to include plenty of vegetables, fruits, low-fat dairy products, and lean protein.  Do not eat a lot of foods high in solid fats, added sugars, or salt.  Get regular exercise. This is one of the most important things you can do for your health.  Most adults should exercise for at least 150 minutes each week. The exercise should increase your heart rate and make you sweat (moderate-intensity exercise).  Most adults should also do strengthening exercises at least twice a week. This is in addition to the moderate-intensity exercise. Maintain a healthy weight  Body mass index (BMI) is a measurement that can be used to identify possible weight problems. It estimates body fat based on height and weight. Your health care provider can help determine your BMI and help you achieve or maintain a healthy weight.  For females 76 years of age and older:  A BMI below 18.5 is considered underweight.  A BMI of 18.5 to 24.9 is normal.  A BMI of 25 to 29.9 is considered overweight.  A BMI of 30 and above is considered obese. Watch levels of cholesterol and blood lipids  You should start having your blood tested for lipids and cholesterol at 71 years of age, then have this test every 5 years.  You may need to have your cholesterol levels checked more often if:  Your lipid or  cholesterol levels are high.  You are older than 71 years of age.  You are at high risk for heart disease. Cancer screening Lung Cancer  Lung cancer screening is recommended for adults 64-42 years old who are at high risk for lung cancer because of a history of smoking.  A yearly low-dose CT scan of the lungs is recommended for people who:  Currently smoke.  Have quit within the past 15 years.  Have at least a 30-pack-year history of smoking. A pack year is smoking an average of one pack of cigarettes a day for 1 year.  Yearly screening should continue until it has been 15 years since you quit.  Yearly screening should stop if you develop a health problem that would prevent you from having lung cancer treatment. Breast Cancer  Practice breast self-awareness. This means understanding how your breasts normally appear and feel.  It also means doing regular breast self-exams. Let your health care provider know about any changes, no matter how small.  If you are in your 20s or 30s, you should have a clinical breast exam (CBE) by a health care provider every 1-3 years as part of a regular health exam.  If you are 34 or older, have a CBE every year. Also consider having a breast X-ray (mammogram) every year.  If you have a family history of breast cancer, talk to your health care provider about genetic screening.  If you are at high risk for breast cancer, talk  to your health care provider about having an MRI and a mammogram every year.  Breast cancer gene (BRCA) assessment is recommended for women who have family members with BRCA-related cancers. BRCA-related cancers include:  Breast.  Ovarian.  Tubal.  Peritoneal cancers.  Results of the assessment will determine the need for genetic counseling and BRCA1 and BRCA2 testing. Cervical Cancer  Your health care provider may recommend that you be screened regularly for cancer of the pelvic organs (ovaries, uterus, and vagina).  This screening involves a pelvic examination, including checking for microscopic changes to the surface of your cervix (Pap test). You may be encouraged to have this screening done every 3 years, beginning at age 24.  For women ages 66-65, health care providers may recommend pelvic exams and Pap testing every 3 years, or they may recommend the Pap and pelvic exam, combined with testing for human papilloma virus (HPV), every 5 years. Some types of HPV increase your risk of cervical cancer. Testing for HPV may also be done on women of any age with unclear Pap test results.  Other health care providers may not recommend any screening for nonpregnant women who are considered low risk for pelvic cancer and who do not have symptoms. Ask your health care provider if a screening pelvic exam is right for you.  If you have had past treatment for cervical cancer or a condition that could lead to cancer, you need Pap tests and screening for cancer for at least 20 years after your treatment. If Pap tests have been discontinued, your risk factors (such as having a new sexual partner) need to be reassessed to determine if screening should resume. Some women have medical problems that increase the chance of getting cervical cancer. In these cases, your health care provider may recommend more frequent screening and Pap tests. Colorectal Cancer  This type of cancer can be detected and often prevented.  Routine colorectal cancer screening usually begins at 71 years of age and continues through 71 years of age.  Your health care provider may recommend screening at an earlier age if you have risk factors for colon cancer.  Your health care provider may also recommend using home test kits to check for hidden blood in the stool.  A small camera at the end of a tube can be used to examine your colon directly (sigmoidoscopy or colonoscopy). This is done to check for the earliest forms of colorectal cancer.  Routine  screening usually begins at age 41.  Direct examination of the colon should be repeated every 5-10 years through 71 years of age. However, you may need to be screened more often if early forms of precancerous polyps or small growths are found. Skin Cancer  Check your skin from head to toe regularly.  Tell your health care provider about any new moles or changes in moles, especially if there is a change in a mole's shape or color.  Also tell your health care provider if you have a mole that is larger than the size of a pencil eraser.  Always use sunscreen. Apply sunscreen liberally and repeatedly throughout the day.  Protect yourself by wearing long sleeves, pants, a wide-brimmed hat, and sunglasses whenever you are outside. Heart disease, diabetes, and high blood pressure  High blood pressure causes heart disease and increases the risk of stroke. High blood pressure is more likely to develop in:  People who have blood pressure in the high end of the normal range (130-139/85-89 mm Hg).  People who are overweight or obese.  People who are African American.  If you are 59-24 years of age, have your blood pressure checked every 3-5 years. If you are 34 years of age or older, have your blood pressure checked every year. You should have your blood pressure measured twice-once when you are at a hospital or clinic, and once when you are not at a hospital or clinic. Record the average of the two measurements. To check your blood pressure when you are not at a hospital or clinic, you can use:  An automated blood pressure machine at a pharmacy.  A home blood pressure monitor.  If you are between 29 years and 60 years old, ask your health care provider if you should take aspirin to prevent strokes.  Have regular diabetes screenings. This involves taking a blood sample to check your fasting blood sugar level.  If you are at a normal weight and have a low risk for diabetes, have this test once  every three years after 71 years of age.  If you are overweight and have a high risk for diabetes, consider being tested at a younger age or more often. Preventing infection Hepatitis B  If you have a higher risk for hepatitis B, you should be screened for this virus. You are considered at high risk for hepatitis B if:  You were born in a country where hepatitis B is common. Ask your health care provider which countries are considered high risk.  Your parents were born in a high-risk country, and you have not been immunized against hepatitis B (hepatitis B vaccine).  You have HIV or AIDS.  You use needles to inject street drugs.  You live with someone who has hepatitis B.  You have had sex with someone who has hepatitis B.  You get hemodialysis treatment.  You take certain medicines for conditions, including cancer, organ transplantation, and autoimmune conditions. Hepatitis C  Blood testing is recommended for:  Everyone born from [birth year] through 1965.  Anyone with known risk factors for hepatitis C. Sexually transmitted infections (STIs)  You should be screened for sexually transmitted infections (STIs) including gonorrhea and chlamydia if:  You are sexually active and are younger than 71 years of age.  You are older than 70 years of age and your health care provider tells you that you are at risk for this type of infection.  Your sexual activity has changed since you were last screened and you are at an increased risk for chlamydia or gonorrhea. Ask your health care provider if you are at risk.  If you do not have HIV, but are at risk, it may be recommended that you take a prescription medicine daily to prevent HIV infection. This is called pre-exposure prophylaxis (PrEP). You are considered at risk if:  You are sexually active and do not regularly use condoms or know the HIV status of your partner(s).  You take drugs by injection.  You are sexually active with a partner  who has HIV. Talk with your health care provider about whether you are at high risk of being infected with HIV. If you choose to begin PrEP, you should first be tested for HIV. You should then be tested every 3 months for as long as you are taking PrEP. Pregnancy  If you are premenopausal and you may become pregnant, ask your health care provider about preconception counseling.  If you may become pregnant, take 400 to 800 micrograms (mcg) of folic acid  every day.  If you want to prevent pregnancy, talk to your health care provider about birth control (contraception). Osteoporosis and menopause  Osteoporosis is a disease in which the bones lose minerals and strength with aging. This can result in serious bone fractures. Your risk for osteoporosis can be identified using a bone density scan.  If you are 4 years of age or older, or if you are at risk for osteoporosis and fractures, ask your health care provider if you should be screened.  Ask your health care provider whether you should take a calcium or vitamin D supplement to lower your risk for osteoporosis.  Menopause may have certain physical symptoms and risks.  Hormone replacement therapy may reduce some of these symptoms and risks. Talk to your health care provider about whether hormone replacement therapy is right for you. Follow these instructions at home:  Schedule regular health, dental, and eye exams.  Stay current with your immunizations.  Do not use any tobacco products including cigarettes, chewing tobacco, or electronic cigarettes.  If you are pregnant, do not drink alcohol.  If you are breastfeeding, limit how much and how often you drink alcohol.  Limit alcohol intake to no more than 1 drink per day for nonpregnant women. One drink equals 12 ounces of beer, 5 ounces of wine, or 1 ounces of hard liquor.  Do not use street drugs.  Do not share needles.  Ask your health care provider for help if you need support  or information about quitting drugs.  Tell your health care provider if you often feel depressed.  Tell your health care provider if you have ever been abused or do not feel safe at home. This information is not intended to replace advice given to you by your health care provider. Make sure you discuss any questions you have with your health care provider. Document Released: 10/11/2010 Document Revised: 09/03/2015 Document Reviewed: 12/30/2014 Elsevier Interactive Patient Education  2017 [employer].

## 2016-07-06 NOTE — Progress Notes (Signed)
Patient: Amanda Walton, Female    DOB: 03/08/1946, 71 y.o.   MRN: [id number] Visit Date: 07/06/2016  Today's Provider: Trinna Post, PA-C   Chief Complaint  Patient presents with  . Medicare Wellness   Subjective:    Annual wellness visit Amanda Walton is a 71 y.o. female. She feels well. She reports exercising daily for 20 minutes. She reports she is sleeping fairly well.  She lives with her husband. She has two children and six grandchildren, ages 64 - 65.   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   She is currently exercising 30 minutes per day, rides her bike. She feels well, no complaints today.  Some remote history of adult hypothyroidism that has normalized recently.   She has a history of osteoporosis and was on Fosamax for five years many years ago. Most recent DEXA scan in 2016 showed osteopenia. Due for another this year.  Due for mammogram this year. No hx breast biopsy or abnormal mammogram. No hx of breast cancer in family.  Last colonoscopy in 2016 with Dr. Allen Norris was normal. This was her second.  Taking 10 mg zocor with no problems. Continues with daily ASA 81 mg.    -----------------------------------------------------------   Review of Systems  Constitutional: Negative.   HENT: Negative.   Eyes: Negative.   Respiratory: Negative.   Cardiovascular: Negative.   Gastrointestinal: Negative.   Endocrine: Negative.   Genitourinary: Negative.   Musculoskeletal: Positive for arthralgias and back pain.  Skin: Negative.   Allergic/Immunologic: Negative.   Neurological: Negative.   Hematological: Negative.   Psychiatric/Behavioral: Negative.     Social History   Social History  . Marital status: Married    Spouse name: N/A  . Number of children: N/A  . Years of education: N/A   Occupational History  . Not on file.    Social History Main Topics  . Smoking status: Former Smoker    Quit date: 04/10/1989  . Smokeless tobacco: Never Used  . Alcohol use 4.2 - 4.8 oz/week    7 - 8 Glasses of wine per week  . Drug use: No  . Sexual activity: Not on file   Other Topics Concern  . Not on file   Social History Narrative  . No narrative on file    Past Medical History:  Diagnosis Date  . Arthritis      Patient Active Problem List   Diagnosis Date Noted  . S/P right THA, AA 08/04/2015  . Serum potassium elevated 07/08/2015  . Arthritis 05/20/2015  . Blood in the urine 05/20/2015  . Hypercholesteremia 01/21/2009  . Adult hypothyroidism 07/26/2007  . OP (osteoporosis) 07/13/2006  . Displacement of lumbar intervertebral disc without myelopathy 11/13/2004    Past Surgical History:  Procedure Laterality Date  . TONSILLECTOMY AND ADENOIDECTOMY    . TOTAL HIP ARTHROPLASTY Right 08/04/2015   Procedure: RIGHT TOTAL HIP ARTHROPLASTY ANTERIOR APPROACH;  Surgeon: Paralee Cancel, MD;  Location: [facility];  Service: Orthopedics;  Laterality: Right;  . TUBAL LIGATION      Her family history includes Dementia in her mother; Heart disease in her father; Hyperlipidemia in her sister; Hypertension in her father; Pancreatic cancer in her sister.      Current Outpatient Prescriptions:  .  aspirin (ASPIRIN LOW DOSE) 81 MG EC tablet, Take 81 mg by mouth daily.  Swallow whole., Disp: , Rfl:  .  CALCIUM CARBONATE-VITAMIN D PO, Take 2 tablets by mouth daily. , Disp: , Rfl:  .  FIBER COMPLETE PO, Take 2 capsules by mouth daily. , Disp: , Rfl:  .  simvastatin (ZOCOR) 10 MG tablet, TAKE ONE TABLET AT BEDTIME, Disp: 30 tablet, Rfl: 5  Patient Care Team: Birdie Sons, MD as PCP - General (Family Medicine)     Objective:   Vitals: BP 132/78 (BP Location: Left Arm, Patient Position: Sitting, Cuff Size: Normal)   Pulse 68   Temp 98.8 F (37.1 C) (Oral)   Resp 16   Ht 5' 2.5" (1.588 m)   Wt 135 lb (61.2 kg)   BMI  24.30 kg/m   Physical Exam  Constitutional: She is oriented to person, place, and time. She appears well-developed and well-nourished.  HENT:  Right Ear: Tympanic membrane and external ear normal.  Left Ear: Tympanic membrane and external ear normal.  Mouth/Throat: Oropharynx is clear and moist. No oropharyngeal exudate.  Eyes: Conjunctivae are normal.  Neck: Neck supple.  Cardiovascular: Normal rate and regular rhythm.   Pulmonary/Chest: Effort normal and breath sounds normal. No respiratory distress. She has no wheezes. She has no rales. Right breast exhibits no inverted nipple, no mass, no nipple discharge, no skin change and no tenderness. Left breast exhibits no inverted nipple, no mass, no nipple discharge, no skin change and no tenderness. Breasts are symmetrical.  Chaperoned breast exam  Abdominal: Soft. Bowel sounds are normal. She exhibits no distension. There is no tenderness. There is no rebound and no guarding.  Lymphadenopathy:    She has no cervical adenopathy.  Neurological: She is alert and oriented to person, place, and time.  Skin: Skin is warm and dry.  Psychiatric: She has a normal mood and affect. Her behavior is normal.    Activities of Daily Living In your present state of health, do you have any difficulty performing the following activities: 07/06/2016 08/04/2015  Hearing? N N  Vision? N N  Difficulty concentrating or making decisions? N N  Walking or climbing stairs? N Y  Dressing or bathing? N N  Doing errands, shopping? N N  Some recent data might be hidden    Fall Risk Assessment Fall Risk  07/06/2016 07/01/2015  Falls in the past year? No No     Depression Screen PHQ 2/9 Scores 07/06/2016 07/01/2015  PHQ - 2 Score 0 0  PHQ- 9 Score 0 -    Cognitive Testing - 6-CIT  Correct? Score   What year is it? yes 0 0 or 4  What month is it? yes 0 0 or 3  Memorize:    Amanda Walton,  42,  [place],      What time is it? (within 1 hour) yes 0 0  or 3  Count backwards from 20 yes 0 0, 2, or 4  Name the months of the year no 2 0, 2, or 4  Repeat name & address above yes 0 0, 2, 4, 6, 8, or 10       TOTAL SCORE  2/28   Interpretation:  Normal  Normal (0-7) Abnormal (8-28)       Assessment & Plan:     Annual Wellness Visit  Reviewed patient's Family Medical History Reviewed and updated list of patient's medical providers Assessment of cognitive impairment was done Assessed patient's functional ability Established a written schedule for health screening [place] Completed  and Reviewed  Exercise Activities and Dietary recommendations Goals    None      Immunization History  Administered Date(s) Administered  . Pneumococcal Conjugate-13 04/23/2014  . Pneumococcal Polysaccharide-23 03/18/2011  . Tdap 07/06/2005  . Zoster 01/16/2009    Health Maintenance  Topic Date Due  . COLONOSCOPY  03/06/1996  . TETANUS/TDAP  07/07/2015  . INFLUENZA VACCINE  11/10/2015  . MAMMOGRAM  10/07/2016  . DEXA SCAN  Completed  . Hepatitis C Screening  Completed  . PNA vac Low Risk Adult  Completed     Discussed health benefits of physical activity, and encouraged her to engage in regular exercise appropriate for her age and condition.   1. Annual physical exam  UTD vaccinations.   - CBC with Differential/Platelet  2. Breast cancer screening  - MM Digital Screening; Future  3. Osteopenia, unspecified location  - DG Bone Density; Future  4. Adult hypothyroidism  - TSH  5. Hypercholesteremia  - Lipid panel - simvastatin (ZOCOR) 10 MG tablet; Take 1 tablet (10 mg total) by mouth at bedtime.  Dispense: 30 tablet; Refill: 5  6. Serum potassium elevated  - Comprehensive metabolic panel  Return in about 1 year (around 07/06/2017) for CPE.  The entirety of the information documented in the History of Present Illness, Review of Systems and Physical Exam were personally obtained by me. Portions of  this information were initially documented by Ashley Royalty, CMA and reviewed by me for thoroughness and accuracy.   ------------------------------------------------------------------------------------------------------------    Trinna Post, PA-C  [facility]

## 2016-07-07 ENCOUNTER — Telehealth: Payer: Self-pay

## 2016-07-07 LAB — LIPID PANEL
Chol/HDL Ratio: 3.6 ratio units (ref 0.0–4.4)
Cholesterol, Total: 197 mg/dL (ref 100–199)
HDL: 55 mg/dL (ref >39)
LDL Calculated: 118 mg/dL — ABNORMAL HIGH (ref 0–99)
Triglycerides: 121 mg/dL (ref 0–149)
VLDL Cholesterol Cal: 24 mg/dL (ref 5–40)

## 2016-07-07 LAB — CBC WITH DIFFERENTIAL/PLATELET
Basophils Absolute: 0 10*3/uL (ref 0.0–0.2)
Basos: 1 %
EOS (ABSOLUTE): 0.1 10*3/uL (ref 0.0–0.4)
Eos: 2 %
Hematocrit: 41.4 % (ref 34.0–46.6)
Hemoglobin: 13.7 g/dL (ref 11.1–15.9)
Immature Grans (Abs): 0 10*3/uL (ref 0.0–0.1)
Immature Granulocytes: 0 %
Lymphocytes Absolute: 1.8 10*3/uL (ref 0.7–3.1)
Lymphs: 31 %
MCH: 31.6 pg (ref 26.6–33.0)
MCHC: 33.1 g/dL (ref 31.5–35.7)
MCV: 96 fL (ref 79–97)
Monocytes Absolute: 0.5 10*3/uL (ref 0.1–0.9)
Monocytes: 9 %
Neutrophils Absolute: 3.3 10*3/uL (ref 1.4–7.0)
Neutrophils: 57 %
Platelets: 340 10*3/uL (ref 150–379)
RBC: 4.33 x10E6/uL (ref 3.77–5.28)
RDW: 14.2 % (ref 12.3–15.4)
WBC: 5.7 10*3/uL (ref 3.4–10.8)

## 2016-07-07 LAB — COMPREHENSIVE METABOLIC PANEL
ALT: 14 IU/L (ref 0–32)
AST: 21 IU/L (ref 0–40)
Albumin/Globulin Ratio: 1.3 (ref 1.2–2.2)
Albumin: 4.3 g/dL (ref 3.5–4.8)
Alkaline Phosphatase: 69 IU/L (ref 39–117)
BUN/Creatinine Ratio: 28 (ref 12–28)
BUN: 17 mg/dL (ref 8–27)
Bilirubin Total: 0.4 mg/dL (ref 0.0–1.2)
CO2: 27 mmol/L (ref 18–29)
Calcium: 9.7 mg/dL (ref 8.7–10.3)
Chloride: 101 mmol/L (ref 96–106)
Creatinine, Ser: 0.6 mg/dL (ref 0.57–1.00)
GFR calc Af Amer: 107 mL/min/{1.73_m2} (ref >59)
GFR calc non Af Amer: 93 mL/min/{1.73_m2} (ref >59)
Globulin, Total: 3.3 g/dL (ref 1.5–4.5)
Glucose: 94 mg/dL (ref 65–99)
Potassium: 4.9 mmol/L (ref 3.5–5.2)
Sodium: 141 mmol/L (ref 134–144)
Total Protein: 7.6 g/dL (ref 6.0–8.5)

## 2016-07-07 LAB — TSH: TSH: 3.24 u[IU]/mL (ref 0.450–4.500)

## 2016-07-07 NOTE — Telephone Encounter (Signed)
-----   Message from Trinna Post, [place] sent at 07/07/2016  9:29 AM EDT ----- Labs stable. Keep up the good work.

## 2016-07-07 NOTE — Telephone Encounter (Signed)
Pt advised.   Thanks,   -Jailin Moomaw  

## 2016-08-03 DIAGNOSIS — M1611 Unilateral primary osteoarthritis, right hip: Secondary | ICD-10-CM | POA: Diagnosis not present

## 2016-08-16 ENCOUNTER — Other Ambulatory Visit: Payer: Self-pay

## 2016-08-16 ENCOUNTER — Ambulatory Visit
Admission: RE | Admit: 2016-08-16 | Discharge: 2016-08-16 | Disposition: A | Discharge status: Home / Self Care | Payer: PPO | Source: Ambulatory Visit | Attending: Physician Assistant | Admitting: Physician Assistant

## 2016-08-16 DIAGNOSIS — M85852 Other specified disorders of bone density and structure, left thigh: Secondary | ICD-10-CM | POA: Insufficient documentation

## 2016-08-16 DIAGNOSIS — M8580 Other specified disorders of bone density and structure, unspecified site: Secondary | ICD-10-CM | POA: Diagnosis not present

## 2016-08-16 DIAGNOSIS — Z1231 Encounter for screening mammogram for malignant neoplasm of breast: Secondary | ICD-10-CM | POA: Diagnosis not present

## 2016-08-16 DIAGNOSIS — Z1239 Encounter for other screening for malignant neoplasm of breast: Secondary | ICD-10-CM

## 2016-08-16 DIAGNOSIS — M85831 Other specified disorders of bone density and structure, right forearm: Secondary | ICD-10-CM | POA: Insufficient documentation

## 2016-08-17 DIAGNOSIS — H40153 Residual stage of open-angle glaucoma, bilateral: Secondary | ICD-10-CM | POA: Diagnosis not present

## 2016-08-18 ENCOUNTER — Telehealth: Payer: Self-pay

## 2016-08-18 DIAGNOSIS — M8580 Other specified disorders of bone density and structure, unspecified site: Secondary | ICD-10-CM

## 2016-08-18 DIAGNOSIS — Z9189 Other specified personal risk factors, not elsewhere classified: Secondary | ICD-10-CM

## 2016-08-18 NOTE — Telephone Encounter (Signed)
-----   Message from Trinna Post, [place] sent at 08/18/2016  8:52 AM EDT ----- Bone mineral density has decreased from last DEXA scan. It is osteopenic right now but heading towards osteoporosis, and overall/hip fracture risk indicate treatment with Foxamax, which patient has been on before. It's a once weekly pill, must drink with whole cup of water and sit upright for 30 minutes after taking this. If patient agrees, I will send this in for her to start and we will get follow up DEXA in one year.

## 2016-08-18 NOTE — Telephone Encounter (Signed)
Spoke with patient on the phone and advised she states that she would  Agree to restarting Fosamax and wants prescription sent to [place]. KW

## 2016-08-19 MED ORDER — ALENDRONATE SODIUM 70 MG PO TABS: ORAL_TABLET | ORAL | 0 refills | Status: DC

## 2016-08-19 NOTE — Telephone Encounter (Signed)
Sent in 3 mo worth of Fosamax. It is one pill once weekly with a full glass of water on an empty stomach. No medications or antacids within thirty minutes of taking this and must sit upright 30 min after. Will likely be on this for a few years at least. Call us with any problems and for refills. Thanks!

## 2016-08-19 NOTE — Telephone Encounter (Signed)
Pt advised-aa 

## 2017-02-24 ENCOUNTER — Encounter: Payer: Self-pay | Admitting: Obstetrics and Gynecology

## 2017-02-24 ENCOUNTER — Ambulatory Visit (INDEPENDENT_AMBULATORY_CARE_PROVIDER_SITE_OTHER): Payer: PPO | Admitting: Obstetrics and Gynecology

## 2017-02-24 ENCOUNTER — Other Ambulatory Visit: Payer: Self-pay | Admitting: Physician Assistant

## 2017-02-24 VITALS — BP 131/79 | HR 62 | Ht 62.5 in | Wt 137.3 lb

## 2017-02-24 DIAGNOSIS — N368 Other specified disorders of urethra: Secondary | ICD-10-CM

## 2017-02-24 DIAGNOSIS — Z4689 Encounter for fitting and adjustment of other specified devices: Secondary | ICD-10-CM | POA: Diagnosis not present

## 2017-02-24 DIAGNOSIS — N952 Postmenopausal atrophic vaginitis: Secondary | ICD-10-CM

## 2017-02-24 DIAGNOSIS — N812 Incomplete uterovaginal prolapse: Secondary | ICD-10-CM

## 2017-02-24 DIAGNOSIS — E7800 Pure hypercholesterolemia, unspecified: Secondary | ICD-10-CM

## 2017-02-24 MED ORDER — ESTROGENS, CONJUGATED 0.625 MG/GM VA CREA: 1.0000 | TOPICAL_CREAM | VAGINAL | 12 refills | Status: DC

## 2017-02-24 NOTE — Patient Instructions (Signed)
Atrophic Vaginitis Atrophic vaginitis is a condition in which the tissues that line the vagina become dry and thin. This condition is most common in women who have stopped having regular menstrual periods (menopause). This usually starts when a woman is 45-71 years old. Estrogen helps to keep the vagina moist. It stimulates the vagina to produce a clear fluid that lubricates the vagina for sexual intercourse. This fluid also protects the vagina from infection. Lack of estrogen can cause the lining of the vagina to get thinner and dryer. The vagina may also shrink in size. It may become less elastic. Atrophic vaginitis tends to get worse over time as a woman's estrogen level drops. What are the causes? This condition is caused by the normal drop in estrogen that happens around the time of menopause. What increases the risk? Certain conditions or situations may lower a woman's estrogen level, which increases her risk of atrophic vaginitis. These include:  Taking medicine that blocks estrogen.  Having ovaries removed surgically.  Being treated for cancer with X-ray treatment (radiation) or medicines (chemotherapy).  Exercising very hard and often.  Having an eating disorder (anorexia).  Giving birth or breastfeeding.  Being over the age of 50.  Smoking.  What are the signs or symptoms? Symptoms of this condition include:  Pain, soreness, or bleeding during sexual intercourse (dyspareunia).  Vaginal burning, irritation, or itching.  Pain or bleeding during a vaginal examination using a speculum (pelvic exam).  Loss of interest in sexual activity.  Having burning pain when passing urine.  Vaginal discharge that is brown or yellow.  In some cases, there are no symptoms. How is this diagnosed? This condition is diagnosed with a medical history and physical exam. This will include a pelvic exam that checks whether the inside of your vagina appears pale, thin, or dry. Rarely, you may  also have other tests, including:  A urine test.  A test that checks the acid balance in your vaginal fluid (acid balance test).  How is this treated? Treatment for this condition may depend on the severity of your symptoms. Treatment may include:  Using an over-the-counter vaginal lubricant before you have sexual intercourse.  Using a long-acting vaginal moisturizer.  Using low-dose vaginal estrogen for moderate to severe symptoms that do not respond to other treatments. Options include creams, tablets, and inserts (vaginal rings). Before using vaginal estrogen, tell your health care provider if you have a history of: ? Breast cancer. ? Endometrial cancer. ? Blood clots.  Taking medicines. You may be able to take a daily pill for dyspareunia. Discuss all of the risks of this medicine with your health care provider. It is usually not recommended for women who have a family history or personal history of breast cancer.  If your symptoms are very mild and you are not sexually active, you may not need treatment. Follow these instructions at home:  Take medicines only as directed by your health care provider. Do not use herbal or alternative medicines unless your health care provider says that you can.  Use over-the-counter creams, lubricants, or moisturizers for dryness only as directed by your health care provider.  If your atrophic vaginitis is caused by menopause, discuss all of your menopausal symptoms and treatment options with your health care provider.  Do not douche.  Do not use products that can make your vagina dry. These include: ? Scented feminine sprays. ? Scented tampons. ? Scented soaps.  If it hurts to have sex, talk with your sexual   partner. Contact a health care provider if:  Your discharge looks different than normal.  Your vagina has an unusual smell.  You have new symptoms.  Your symptoms do not improve with treatment.  Your symptoms get worse. This  information is not intended to replace advice given to you by your health care provider. Make sure you discuss any questions you have with your health care provider. Document Released: 08/12/2014 Document Revised: 09/03/2015 Document Reviewed: 03/19/2014 Elsevier Interactive Patient Education  2018 [employer].   About Cystocele  Overview The pelvic organs, including the bladder, are normally supported by pelvic floor muscles and ligaments.  When these muscles and ligaments are stretched, weakened or torn, the wall between the bladder and the vagina sags or herniates causing a prolapse, sometimes called a cystocele.  This condition may cause discomfort and problems with emptying the bladder.  It can be present in various stages.  Some people are not aware of the changes.  Others may notice changes at the vaginal opening or a feeling of the bladder dropping outside the body.  Causes of a Cystocele A cystocele is usually caused by muscle straining or stretching during childbirth.  In addition, cystocele is more common after menopause, because the hormone estrogen helps keep the elastic tissues around the pelvic organs strong.  A cystocele is more likely to occur when levels of estrogen decrease.  Other causes include: heavy lifting, chronic coughing, previous pelvic surgery and obesity.  Symptoms A bladder that has dropped from its normal position may cause: unwanted urine leakage (stress incontinence), frequent urination or urge to urinate, incomplete emptying of the bladder (not feeling bladder relief after emptying), pain or discomfort in the vagina, pelvis, groin, lower back or lower abdomen and frequent urinary tract infections.  Mild cases may not cause any symptoms.  Treatment Options Pelvic floor (Kegel) exercises:  Strength training the muscles in your genital area Behavioral changes: Treating and preventing constipation, taking time to empty your bladder properly, learning to lift  properly and/or avoid heavy lifting when possible, stopping smoking, avoiding weight gain and treating a chronic cough or bronchitis. A pessary: A vaginal support device is sometimes used to help pelvic support caused by muscle and ligament changes. Surgery: Surgical repair may be necessary if symptoms cannot be managed with exercise, behavioral changes and a pessary.  Surgery is usually considered for severe cases.   2007, [employer]

## 2017-02-24 NOTE — Progress Notes (Signed)
GYNECOLOGY CLINIC PROGRESS NOTES Subjective:    Amanda Walton is a 71 y.o. P3 female who presents for evaluation of a cystocele. Problem started many years ago. Has a history of pessary use for many years which she can insert and remove each night without difficulty.  Notes that last week she noted discomfort with pessary as she forgot to take it out that night.  Had also had excessive activity (walked 2 miles that day).  Notes that the next day she was very sore in vaginal region which lasted for a week so had to leave the pessary out.  Is being seen today as she wanted to check and make sure that she didn't need a different type of pessary or that her prolapse had worsened.  Patient notes that she has not been seen in some time for pessary maintenance. Symptoms include: discomfort: mild to moderate.    Gynecological History: No LMP recorded. Patient is postmenopausal.  Last pap smear: 2016, notes normal pap.  Denies prior h/o abnormal pap smear.  Last mammogram: 08/2016. Normal results.  Last colonoscopy: 07/2014.  Normal results.     OB History  Gravida Para Term Preterm AB Living  2 2 2     1   SAB TAB Ectopic Multiple Live Births          2    # Outcome Date GA Lbr Len/2nd Weight Sex Delivery Anes PTL Lv  2 Term      Vag-Spont   LIV  1 Term      Vag-Spont          Past Medical History:  Diagnosis Date  . Arthritis     Family History  Problem Relation Age of Onset  . Dementia Mother   . Hypertension Father   . Heart disease Father   . Hyperlipidemia Sister   . Pancreatic cancer Sister   . Breast cancer Neg Hx     Past Surgical History:  Procedure Laterality Date  . RIGHT TOTAL HIP ARTHROPLASTY ANTERIOR APPROACH Right 08/04/2015   Performed by Paralee Cancel, MD at [facility]  . TONSILLECTOMY AND ADENOIDECTOMY    . TUBAL LIGATION      Social History   Socioeconomic History  . Marital status: Married    Spouse name: Not on file  . Number of children: Not on  file  . Years of education: Not on file  . Highest education level: Not on file  Social Needs  . Financial resource strain: Not on file  . Food insecurity - worry: Not on file  . Food insecurity - inability: Not on file  . Transportation needs - medical: Not on file  . Transportation needs - non-medical: Not on file  Occupational History  . Not on file  Tobacco Use  . Smoking status: Former Smoker    Last attempt to quit: 04/10/1989    Years since quitting: 27.8  . Smokeless tobacco: Never Used  Substance and Sexual Activity  . Alcohol use: Yes    Alcohol/week: 4.2 - 4.8 oz    Types: 7 - 8 Glasses of wine per week  . Drug use: No  . Sexual activity: Not on file  Other Topics Concern  . Not on file  Social History Narrative  . Not on file    Current Outpatient Medications on File Prior to Visit  Medication Sig Dispense Refill  . aspirin (ASPIRIN LOW DOSE) 81 MG EC tablet Take 81 mg by mouth daily. Swallow whole.    Marland Kitchen  CALCIUM CARBONATE-VITAMIN D PO Take 2 tablets by mouth daily.     Marland Kitchen FIBER COMPLETE PO Take 2 capsules by mouth daily.     . simvastatin (ZOCOR) 10 MG tablet Take 1 tablet (10 mg total) by mouth at bedtime. 30 tablet 5  . alendronate (FOSAMAX) 70 MG tablet Take 1 tablet once weekly w/ full glass of water on an empty stomach. No other meds within 30 min of taking this, sit upright 30 min after. (Patient not taking: Reported on 02/24/2017) 12 tablet 0   No current facility-administered medications on file prior to visit.     No Known Allergies   Review of Systems Pertinent items noted in HPI and remainder of comprehensive ROS otherwise negative.   Objective:   BP 131/79 (BP Location: Right Arm, Patient Position: Sitting, Cuff Size: Normal)   Pulse 62   Ht 5' 2.5" (1.588 m)   Wt 137 lb 4.8 oz (62.3 kg)   BMI 24.71 kg/m  General appearance: alert and no distress Abdomen: soft, non-tender; bowel sounds normal; no masses,  no organomegaly Pelvic exam:    BLADDER: no bladder distension noted  URETHRA: urethral prolapse visible  VULVA: normal appearing vulva with no masses, tenderness or lesions  VAGINA: atrophic, PELVIC FLOOR EXAM: cystocele Grade 2-3, uterine descensus grade 1-2,   CERVIX: normal appearing cervix without discharge or lesions,    UTERUS: uterus is normal size, shape, consistency and nontender,  ADNEXA: normal adnexa in size, nontender and no masses  RECTAL: rectal exam not indicated. Extremities: extremities normal, atraumatic, no cyanosis or edema Neurologic: Grossly normal   Assessment:   Vaginal atrophy  Pessary maintenance  Urethral prolapse  Cystocele with incomplete uterovaginal prolapse    Plan:    - Vaginal atrophy.  Discussed that the pessary may have irritated the tissue as moderate vaginal atrophy was present.  Advised on options for management of atrophy, including local estrogen or testosterone therapy, and the different vehicles of administration. Discussed risks and benefits.  Patient ok to begin Premarin cream.  Will prescribe. To use daily for 2 weeks, then decrease to 2-3 times weekly.  - Pessary maintenance. Advised to continue use of current pessary (Size 4 ring with support) along with premarin for treatment of vaginal atrophy. If no improvement in symptoms, can discuss changing in size.  Patient wonders when surgery would be indicated. Advised on reasons for pursuing surgical management.  Patient notes she will think about it, but will likely continue pessary use for now.  - Urethral prolapse noted today.  Will treat with Premarin cream.  If no improvement over the next several weeks, may send to Urology for further evaluation, management.  - Cystocele with uterine descensus. Currently being managed with pessary.    RTC in 4 weeks, f/u vaginal atrophy.    Rubie Maid, MD [facility]

## 2017-02-27 ENCOUNTER — Telehealth: Payer: Self-pay | Admitting: Obstetrics and Gynecology

## 2017-02-27 NOTE — Telephone Encounter (Signed)
Dr. Marcelline Mates,   I spoke with pt and she was requesting a generic of the Premarin due to it going to cost her $90. There is no generic to this medication. Did you want to send something else in

## 2017-02-27 NOTE — Telephone Encounter (Signed)
Please inform her that there is no generic as of yet.  The tube usually lasts for ~ 6 months, so really the cost would break down to ~ $15 dollars per month, (or $90 for the prescription up front).  I think that I gave her a coupon card where the prescription should have been no more than $25.  Otherwise, we can call in a prescription for an equivalent to a compounding pharmacy.

## 2017-02-27 NOTE — Telephone Encounter (Signed)
The patient called and stated that she needs the generic brand of her medication  conjugated estrogens (PREMARIN) vaginal cream/ No other information was disclosed other than wanting to receive a call back. Please advise.

## 2017-02-28 DIAGNOSIS — H6123 Impacted cerumen, bilateral: Secondary | ICD-10-CM | POA: Diagnosis not present

## 2017-02-28 DIAGNOSIS — J301 Allergic rhinitis due to pollen: Secondary | ICD-10-CM | POA: Diagnosis not present

## 2017-02-28 DIAGNOSIS — H6063 Unspecified chronic otitis externa, bilateral: Secondary | ICD-10-CM | POA: Diagnosis not present

## 2017-02-28 NOTE — Telephone Encounter (Signed)
Dr. Marcelline Mates,   I spoke with pt and she still is unable to afford the $90. I looked at the coupons we have and due to her insurance they will not work. Pt is ok with [place] sending another rx to [place]

## 2017-02-28 NOTE — Telephone Encounter (Signed)
Ok.  Amanda Walton is the expert on sending things to the compounding pharmacy.  We just need the equivalent of Premarin cream.

## 2017-02-28 NOTE — Telephone Encounter (Signed)
Spoke with pt and informed her that we will have to find what will be covered under her insurance. Pt states she has her formulary that she will make a copy of and drop it off. Placed more samples of Premarin up front for pt

## 2017-03-06 NOTE — Telephone Encounter (Signed)
Received pt's formulary, will discuss with Dr. Marcelline Mates

## 2017-03-07 NOTE — Telephone Encounter (Signed)
Looked at pt's formulary the next alternative is a tier 3 and up. When I spoke with pharmacist due to her insurance it will always be a 90 day co-pay, they can not fill for 30 day. Left message on machine to see if pt is able to afford $50 payment and she go through [place] for a compound

## 2017-03-08 ENCOUNTER — Telehealth: Payer: Self-pay | Admitting: Obstetrics and Gynecology

## 2017-03-08 NOTE — Telephone Encounter (Signed)
Closing message. Pt has another phone message already open

## 2017-03-08 NOTE — Telephone Encounter (Signed)
Spoke with pt and she states she is interested in the compound mix but she would like to think about it and get back to [place]

## 2017-03-08 NOTE — Telephone Encounter (Signed)
The patient called and stated that she missed a call from Dr. Marcelline Mates and would like a call back , No other information was disclosed. Please advise.

## 2017-03-23 ENCOUNTER — Other Ambulatory Visit: Payer: Self-pay | Admitting: Pharmacy Technician

## 2017-03-23 ENCOUNTER — Telehealth: Payer: Self-pay | Admitting: Family Medicine

## 2017-03-23 DIAGNOSIS — E7800 Pure hypercholesterolemia, unspecified: Secondary | ICD-10-CM

## 2017-03-23 DIAGNOSIS — M8580 Other specified disorders of bone density and structure, unspecified site: Secondary | ICD-10-CM

## 2017-03-23 DIAGNOSIS — Z9189 Other specified personal risk factors, not elsewhere classified: Secondary | ICD-10-CM

## 2017-03-23 NOTE — Patient Outreach (Signed)
[facility] Care Management  03/23/2017  Tylea Hise [birth year] [id number]   Incoming [employer] EMMI call in reference to medication adherence. HIPAA identifiers verified and verbal consent received. Patient states she takes her medications daily as prescribed and does not currently have any barriers. I informed her that she could get 3 month supplies with [facility] and it would save her a copay. At the patient's request I contacted the office of Marton Redwood, PA-C and spoke with Sharyn Lull about having new prescription's for Alendronate and Simavastatin sent to [facility].  Doreene Burke, [place] [phone]

## 2017-03-23 NOTE — Telephone Encounter (Signed)
Pt contacted office for refill request on the following medications:  simvastatin (ZOCOR) 10 MG tablet  alendronate (FOSAMAX) 70 MG tablet   90 day supply.  [place]  [phone] with [facility]

## 2017-03-23 NOTE — Telephone Encounter (Signed)
Please review. Thanks!  

## 2017-03-24 MED ORDER — SIMVASTATIN 10 MG PO TABS: 10.0000 mg | ORAL_TABLET | Freq: Every day | ORAL | 0 refills | Status: DC

## 2017-03-24 MED ORDER — ALENDRONATE SODIUM 70 MG PO TABS: ORAL_TABLET | ORAL | 0 refills | Status: DC

## 2017-03-24 NOTE — Telephone Encounter (Signed)
90 day supply Fosamax and zocor sent to [facility].

## 2017-04-07 ENCOUNTER — Encounter: Payer: Self-pay | Admitting: Obstetrics and Gynecology

## 2017-04-25 DIAGNOSIS — H40153 Residual stage of open-angle glaucoma, bilateral: Secondary | ICD-10-CM | POA: Diagnosis not present

## 2017-05-16 ENCOUNTER — Telehealth: Payer: Self-pay | Admitting: Family Medicine

## 2017-07-04 ENCOUNTER — Ambulatory Visit (INDEPENDENT_AMBULATORY_CARE_PROVIDER_SITE_OTHER): Payer: PPO

## 2017-07-04 ENCOUNTER — Ambulatory Visit (INDEPENDENT_AMBULATORY_CARE_PROVIDER_SITE_OTHER): Payer: PPO | Admitting: Family Medicine

## 2017-07-04 ENCOUNTER — Encounter: Payer: Self-pay | Admitting: Family Medicine

## 2017-07-04 VITALS — BP 126/66 | HR 72 | Temp 98.1°F | Ht 63.0 in | Wt 138.0 lb

## 2017-07-04 VITALS — BP 126/66 | HR 72 | Temp 98.1°F | Resp 16 | Ht 63.0 in | Wt 138.0 lb

## 2017-07-04 DIAGNOSIS — M810 Age-related osteoporosis without current pathological fracture: Secondary | ICD-10-CM | POA: Diagnosis not present

## 2017-07-04 DIAGNOSIS — Z0000 Encounter for general adult medical examination without abnormal findings: Secondary | ICD-10-CM

## 2017-07-04 DIAGNOSIS — E7800 Pure hypercholesterolemia, unspecified: Secondary | ICD-10-CM | POA: Diagnosis not present

## 2017-07-04 DIAGNOSIS — E039 Hypothyroidism, unspecified: Secondary | ICD-10-CM

## 2017-07-04 DIAGNOSIS — M654 Radial styloid tenosynovitis [de Quervain]: Secondary | ICD-10-CM | POA: Diagnosis not present

## 2017-07-04 NOTE — Patient Instructions (Signed)

## 2017-07-04 NOTE — Progress Notes (Signed)
Subjective:   Amanda Walton is a 72 y.o. female who presents for Medicare Annual (Subsequent) preventive examination.  Review of Systems:  N/A  Cardiac Risk Factors include: advanced age (>51men, >9 women);dyslipidemia;sedentary lifestyle     Objective:     Vitals: BP 126/66 (BP Location: Left Arm)   Pulse 72   Temp 98.1 F (36.7 C) (Oral)   Ht 5\' 3"  (1.6 m)   Wt 138 lb (62.6 kg)   BMI 24.45 kg/m   Body mass index is 24.45 kg/m.  Advanced Directives 07/04/2017 07/06/2016 03/26/2016 08/04/2015 07/22/2015 07/01/2015  Does Patient Have a Medical Advance Directive? Yes Yes Yes Yes Yes No  Type of Paramedic of [place];Living will [place];Living will Living will [place];Living will Living will;Healthcare Power of Attorney -  Does patient want to make changes to medical advance directive? - - - No - Patient declined No - Patient declined -  Copy of [place] in Chart? Yes - - No - copy requested No - copy requested -    Tobacco Social History   Tobacco Use  Smoking Status Former Smoker  . Last attempt to quit: 04/10/1989  . Years since quitting: 28.2  Smokeless Tobacco Never Used     Counseling given: Not Answered   Clinical Intake:  Pre-visit preparation completed: Yes  Pain : No/denies pain Pain Score: 0-No pain     Nutritional Status: BMI of 19-24  Normal Nutritional Risks: None Diabetes: No  How often do you need to have someone help you when you read instructions, pamphlets, or other written materials from your doctor or pharmacy?: 1 - Never  Interpreter Needed?: No  Information entered by :: [facility], LPN  Past Medical History:  Diagnosis Date  . Arthritis    Past Surgical History:  Procedure Laterality Date  . TONSILLECTOMY AND ADENOIDECTOMY    . TOTAL HIP ARTHROPLASTY Right 08/04/2015   Procedure: RIGHT TOTAL HIP ARTHROPLASTY ANTERIOR APPROACH;  Surgeon:  Paralee Cancel, MD;  Location: [facility];  Service: Orthopedics;  Laterality: Right;  . TUBAL LIGATION     Family History  Problem Relation Age of Onset  . Dementia Mother   . Hypertension Father   . Heart disease Father   . Hyperlipidemia Sister   . Pancreatic cancer Sister   . Breast cancer Neg Hx    Social History   Socioeconomic History  . Marital status: Married    Spouse name: Not on file  . Number of children: 2  . Years of education: Not on file  . Highest education level: Associate degree: occupational, Hotel manager, or vocational program  Occupational History  . Occupation: retired    Comment: does some part time work at [employer]  . Financial resource strain: Not hard at all  . Food insecurity:    Worry: Never true    Inability: Never true  . Transportation needs:    Medical: No    Non-medical: No  Tobacco Use  . Smoking status: Former Smoker    Last attempt to quit: 04/10/1989    Years since quitting: 28.2  . Smokeless tobacco: Never Used  Substance and Sexual Activity  . Alcohol use: Yes    Alcohol/week: 5.4 oz    Types: 9 Glasses of wine per week    Comment: 1 glass M-F, a couple on the weekend  . Drug use: No  . Sexual activity: Not on file  Lifestyle  .  Physical activity:    Days per week: Not on file    Minutes per session: Not on file  . Stress: Not at all  Relationships  . Social connections:    Talks on phone: Not on file    Gets together: Not on file    Attends religious service: Not on file    Active member of club or organization: Not on file    Attends meetings of clubs or organizations: Not on file    Relationship status: Not on file  Other Topics Concern  . Not on file  Social History Narrative  . Not on file    Outpatient Encounter Medications as of 07/04/2017  Medication Sig  . aspirin (ASPIRIN LOW DOSE) 81 MG EC tablet Take 81 mg by mouth daily. Swallow whole.  Marland Kitchen CALCIUM CARBONATE-VITAMIN D PO Take 2 tablets by mouth  daily.   Marland Kitchen FIBER COMPLETE PO Take 2 capsules by mouth daily.   . simvastatin (ZOCOR) 10 MG tablet Take 1 tablet (10 mg total) by mouth at bedtime.  Marland Kitchen alendronate (FOSAMAX) 70 MG tablet Take 1 tablet once weekly w/ full glass of water on an empty stomach. No other meds within 30 min of taking this, sit upright 30 min after. (Patient not taking: Reported on 07/04/2017)  . conjugated estrogens (PREMARIN) vaginal cream Place 1 Applicatorful 2 (two) times a week vaginally. (Patient not taking: Reported on 07/04/2017)   No facility-administered encounter medications on file as of 07/04/2017.     Activities of Daily Living In your present state of health, do you have any difficulty performing the following activities: 07/04/2017 07/06/2016  Hearing? N N  Vision? N N  Difficulty concentrating or making decisions? N N  Walking or climbing stairs? N N  Dressing or bathing? N N  Doing errands, shopping? N N  Preparing Food and eating ? N -  Using the Toilet? N -  In the past six months, have you accidently leaked urine? N -  Do you have problems with loss of bowel control? N -  Managing your Medications? N -  Managing your Finances? N -  Housekeeping or managing your Housekeeping? N -  Some recent data might be hidden    Patient Care Team: Birdie Sons, MD as PCP - General (Family Medicine) Rubie Maid, MD as Referring Physician (Obstetrics and Gynecology) Dasher, Rayvon Char, MD as Consulting Physician (Dermatology) Lorelee Cover., MD as Consulting Physician (Ophthalmology)    Assessment:   This is a routine wellness examination for Amanda Walton.  Exercise Activities and Dietary recommendations Current Exercise Habits: Home exercise routine, Type of exercise: Other - see comments(stationary bicycle and situps), Time (Minutes): 20, Frequency (Times/Week): 7, Weekly Exercise (Minutes/Week): 140, Intensity: Mild, Exercise limited by: None identified  Goals    . DIET - INCREASE WATER INTAKE      Recommend increasing water intake to 4-6 glasses a day.        Fall Risk Fall Risk  07/04/2017 07/06/2016 07/01/2015  Falls in the past year? No No No   Is the patient's home free of loose throw rugs in walkways, pet beds, electrical cords, etc?   yes      Grab bars in the bathroom? yes      Handrails on the stairs?   yes      Adequate lighting?   yes  Timed Get Up and Go performed: N/A  Depression Screen PHQ 2/9 Scores 07/04/2017 07/06/2016 07/01/2015  PHQ - 2 Score  0 0 0  PHQ- 9 Score - 0 -     Cognitive Function     6CIT Screen 07/04/2017  What Year? 0 points  What month? 0 points  What time? 0 points  Count back from 20 0 points  Months in reverse 0 points  Repeat phrase 0 points  Total Score 0    Immunization History  Administered Date(s) Administered  . Influenza Split 01/16/2009, 01/22/2010, 03/21/2012  . Influenza, High Dose Seasonal PF 04/23/2014, 04/20/2017  . Influenza,inj,Quad PF,6+ Mos 03/27/2013  . Pneumococcal Conjugate-13 04/23/2014  . Pneumococcal Polysaccharide-23 03/18/2011  . Tdap 07/06/2005  . Zoster 01/16/2009    Qualifies for Shingles Vaccine? Due for Shingles vaccine. Declined my offer to administer today. Education has been provided regarding the importance of this vaccine. Pt has been advised to call her insurance company to determine her out of pocket expense. Advised she may also receive this vaccine at her local pharmacy or Health Dept. Verbalized acceptance and understanding.  Screening Tests Health Maintenance  Topic Date Due  . TETANUS/TDAP  07/07/2015  . MAMMOGRAM  08/17/2018  . COLONOSCOPY  07/23/2024  . INFLUENZA VACCINE  Completed  . DEXA SCAN  Completed  . Hepatitis C Screening  Completed  . PNA vac Low Risk Adult  Completed    Cancer Screenings: Lung: Low Dose CT Chest recommended if Age 55-80 years, 30 pack-year currently smoking OR have quit w/in 15years. Patient does not qualify. Breast:  Up to date on Mammogram? Yes     Up to date of Bone Density/Dexa? Yes Colorectal: Up to date  Additional Screenings:  Hepatitis C Screening: Up to date     Plan:  I have personally reviewed and addressed the Medicare Annual Wellness questionnaire and have noted the following in the patient's chart:  A. Medical and social history B. Use of alcohol, tobacco or illicit drugs  C. Current medications and supplements D. Functional ability and status E.  Nutritional status F.  Physical activity G. Advance directives H. List of other physicians I.  Hospitalizations, surgeries, and ER visits in previous 12 months J.  [place] such as hearing and vision if needed, cognitive and depression L. Referrals and appointments - none  In addition, I have reviewed and discussed with patient certain preventive protocols, quality metrics, and best practice recommendations. A written personalized care plan for preventive services as well as general preventive health recommendations were provided to patient.  See attached scanned questionnaire for additional information.   Signed,  Fabio Neighbors, LPN Nurse Health Advisor   Nurse Recommendations: Pt declined the tetanus vaccine today.

## 2017-07-04 NOTE — Progress Notes (Signed)
Patient: Amanda Walton, Female    DOB: 02/13/1946, 72 y.o.   MRN: [id number] Visit Date: 07/04/2017  Today's Provider: Lavon Paganini, MD   I, Martha Clan, CMA, am acting as scribe for Lavon Paganini, MD.  Chief Complaint  Patient presents with  . Annual Exam   Subjective:     Complete Physical Amanda Walton is a 72 y.o. female. She feels well. She reports exercising daily for 20 minutes on her stationary bicycle. She reports she is sleeping fairly well. Pt is asking what the current recommendations of aspirin 81 mg po qd are.  Last colonoscopy- 07/24/2014-  Internal hemorrhoids, otherwise WNL. Repeat 10 years. Last mammogram- 08/16/2016- BI-RADS 1 Last BMD- 08/16/2016- OP; is not taking Fosamax. States she forgets to take this.  Patient's only concern today is her right thumb pain.  She ports that the pain has been present a few weeks or months.  It is in her right lateral wrist at the base of her thumb.  It is worse with movement or lifting things.  She has been using an Ace bandage.  She has not tried any medication.  It does not seem to restrict her in her function as she is left-handed but it is painful and appear swollen. -----------------------------------------------------------   Review of Systems  Constitutional: Negative.   HENT: Negative.   Eyes: Negative.   Respiratory: Negative.   Cardiovascular: Negative.   Gastrointestinal: Negative.   Endocrine: Negative.   Genitourinary: Negative.   Musculoskeletal: Negative.   Skin: Negative.   Allergic/Immunologic: Negative.   Neurological: Negative.   Hematological: Negative.   Psychiatric/Behavioral: Negative.     Social History   Socioeconomic History  . Marital status: Married    Spouse name: Not on file  . Number of children: 2  . Years of education: Not on file  . Highest education level: Associate degree: occupational, Hotel manager, or vocational program  Occupational History  .  Occupation: retired    Comment: does some part time work at [employer]  . Financial resource strain: Not hard at all  . Food insecurity:    Worry: Never true    Inability: Never true  . Transportation needs:    Medical: No    Non-medical: No  Tobacco Use  . Smoking status: Former Smoker    Last attempt to quit: 04/10/1989    Years since quitting: 28.2  . Smokeless tobacco: Never Used  Substance and Sexual Activity  . Alcohol use: Yes    Alcohol/week: 5.4 oz    Types: 9 Glasses of wine per week    Comment: 1 glass M-F, a couple on the weekend  . Drug use: No  . Sexual activity: Not on file  Lifestyle  . Physical activity:    Days per week: Not on file    Minutes per session: Not on file  . Stress: Not at all  Relationships  . Social connections:    Talks on phone: Not on file    Gets together: Not on file    Attends religious service: Not on file    Active member of club or organization: Not on file    Attends meetings of clubs or organizations: Not on file    Relationship status: Not on file  . Intimate partner violence:    Fear of current or ex partner: Not on file    Emotionally abused: Not on file    Physically abused: Not on file    Forced  sexual activity: Not on file  Other Topics Concern  . Not on file  Social History Narrative  . Not on file    Past Medical History:  Diagnosis Date  . Arthritis      Patient Active Problem List   Diagnosis Date Noted  . S/P right THA, AA 08/04/2015  . Serum potassium elevated 07/08/2015  . Arthritis 05/20/2015  . Blood in the urine 05/20/2015  . Hypercholesteremia 01/21/2009  . Adult hypothyroidism 07/26/2007  . OP (osteoporosis) 07/13/2006  . Displacement of lumbar intervertebral disc without myelopathy 11/13/2004    Past Surgical History:  Procedure Laterality Date  . TONSILLECTOMY AND ADENOIDECTOMY    . TOTAL HIP ARTHROPLASTY Right 08/04/2015   Procedure: RIGHT TOTAL HIP ARTHROPLASTY ANTERIOR  APPROACH;  Surgeon: Paralee Cancel, MD;  Location: [facility];  Service: Orthopedics;  Laterality: Right;  . TUBAL LIGATION      Her family history includes Dementia in her mother; Heart disease in her father; Hyperlipidemia in her sister; Hypertension in her father; Pancreatic cancer in her sister. There is no history of Breast cancer.      Current Outpatient Medications:  .  alendronate (FOSAMAX) 70 MG tablet, Take 1 tablet once weekly w/ full glass of water on an empty stomach. No other meds within 30 min of taking this, sit upright 30 min after. (Patient not taking: Reported on 07/04/2017), Disp: 12 tablet, Rfl: 0 .  aspirin (ASPIRIN LOW DOSE) 81 MG EC tablet, Take 81 mg by mouth daily. Swallow whole., Disp: , Rfl:  .  CALCIUM CARBONATE-VITAMIN D PO, Take 2 tablets by mouth daily. , Disp: , Rfl:  .  conjugated estrogens (PREMARIN) vaginal cream, Place 1 Applicatorful 2 (two) times a week vaginally. (Patient not taking: Reported on 07/04/2017), Disp: 42.5 g, Rfl: 12 .  FIBER COMPLETE PO, Take 2 capsules by mouth daily. , Disp: , Rfl:  .  simvastatin (ZOCOR) 10 MG tablet, Take 1 tablet (10 mg total) by mouth at bedtime., Disp: 90 tablet, Rfl: 0  Patient Care Team: Birdie Sons, MD as PCP - General (Family Medicine) Rubie Maid, MD as Referring Physician (Obstetrics and Gynecology) Dasher, Rayvon Char, MD as Consulting Physician (Dermatology) Lorelee Cover., MD as Consulting Physician (Ophthalmology)     Objective:   Vitals: There were no vitals taken for this visit.  Physical Exam  Constitutional: She is oriented to person, place, and time. She appears well-developed and well-nourished. No distress.  HENT:  Head: Normocephalic and atraumatic.  Right Ear: External ear normal.  Left Ear: External ear normal.  Nose: Nose normal.  Mouth/Throat: Oropharynx is clear and moist.  Eyes: Pupils are equal, round, and reactive to light. Conjunctivae and EOM are normal. No scleral icterus.  Neck:  Neck supple. No thyromegaly present.  Cardiovascular: Normal rate, regular rhythm, normal heart sounds and intact distal pulses.  No murmur heard. Pulmonary/Chest: Breath sounds normal. No respiratory distress. She has no wheezes. She has no rales.  Abdominal: Soft. Bowel sounds are normal. She exhibits no distension. There is no tenderness. There is no rebound and no guarding.  Musculoskeletal: She exhibits no edema or deformity.       Right wrist: She exhibits tenderness and swelling. She exhibits normal range of motion, no deformity and no laceration. Bony tenderness: over base of thumb.  +finkelsteins  Lymphadenopathy:    She has no cervical adenopathy.  Neurological: She is alert and oriented to person, place, and time.  Skin: Skin is warm and  dry. No rash noted.  Psychiatric: She has a normal mood and affect. Her behavior is normal.  Vitals reviewed.   Activities of Daily Living In your present state of health, do you have any difficulty performing the following activities: 07/04/2017 07/06/2016  Hearing? N N  Vision? N N  Difficulty concentrating or making decisions? N N  Walking or climbing stairs? N N  Dressing or bathing? N N  Doing errands, shopping? N N  Preparing Food and eating ? N -  Using the Toilet? N -  In the past six months, have you accidently leaked urine? N -  Do you have problems with loss of bowel control? N -  Managing your Medications? N -  Managing your Finances? N -  Housekeeping or managing your Housekeeping? N -  Some recent data might be hidden    Fall Risk Assessment Fall Risk  07/04/2017 07/06/2016 07/01/2015  Falls in the past year? No No No     Depression Screen PHQ 2/9 Scores 07/04/2017 07/06/2016 07/01/2015  PHQ - 2 Score 0 0 0  PHQ- 9 Score - 0 -   6CIT Screen 07/04/2017  What Year? 0 points  What month? 0 points  What time? 0 points  Count back from 20 0 points  Months in reverse 0 points  Repeat phrase 0 points  Total Score 0       Assessment & Plan:    Annual Physical Reviewed patient's Family Medical History Reviewed and updated list of patient's medical providers Assessment of cognitive impairment was done Assessed patient's functional ability Established a written schedule for health screening [place] Completed and Reviewed  Exercise Activities and Dietary recommendations Goals    . DIET - INCREASE WATER INTAKE     Recommend increasing water intake to 4-6 glasses a day.        Immunization History  Administered Date(s) Administered  . Influenza Split 01/16/2009, 01/22/2010, 03/21/2012  . Influenza, High Dose Seasonal PF 04/23/2014, 04/20/2017  . Influenza,inj,Quad PF,6+ Mos 03/27/2013  . Pneumococcal Conjugate-13 04/23/2014  . Pneumococcal Polysaccharide-23 03/18/2011  . Tdap 07/06/2005  . Zoster 01/16/2009    Health Maintenance  Topic Date Due  . Samul Dada  07/07/2015  . MAMMOGRAM  08/17/2018  . COLONOSCOPY  07/23/2024  . INFLUENZA VACCINE  Completed  . DEXA SCAN  Completed  . Hepatitis C Screening  Completed  . PNA vac Low Risk Adult  Completed     Discussed health benefits of physical activity, and encouraged her to engage in regular exercise appropriate for her age and condition.    ------------------------------------------------------------------------------------------------------------  Problem List Items Addressed This Visit      Endocrine   Adult hypothyroidism   Relevant Orders   TSH     Musculoskeletal and Integument   OP (osteoporosis)    Encourage patient to resume her Fosamax Discussed tips for remembering to take it weekly, such as an alarm on her phone or setting it by her coffee pot so that she sees it before she has anything to eat in the morning      De Quervain's tenosynovitis, right    Classic presentation with physical exam findings for de Quervain's tenosynovitis of the right hand Discussed ice, bracing, regular NSAIDs  for the next week at least Given information about stretches and hand exercises If not resolving with conservative measures, could consider referral to hand surgery for possible corticosteroid injection        Other   Hypercholesteremia  Relevant Orders   Comprehensive metabolic panel   Lipid panel    Other Visit Diagnoses    Encounter for annual physical exam    -  Primary   Relevant Orders   Comprehensive metabolic panel   Lipid panel   TSH   CBC       Return in about 1 year (around 07/05/2018) for physical/AWV.   The entirety of the information documented in the History of Present Illness, Review of Systems and Physical Exam were personally obtained by me. Portions of this information were initially documented by Raquel Sarna Ratchford, CMA and reviewed by me for thoroughness and accuracy.    Virginia Crews, MD, MPH [facility] 07/04/2017 10:28 AM

## 2017-07-04 NOTE — Patient Instructions (Addendum)
Amanda Walton , Thank you for taking time to come for your Medicare Wellness Visit. I appreciate your ongoing commitment to your health goals. Please review the following plan we discussed and let me know if I can assist you in the future.   Screening recommendations/referrals: Colonoscopy: Up to date Mammogram: Up to date Bone Density: Up to date Recommended yearly ophthalmology/optometry visit for glaucoma screening and checkup Recommended yearly dental visit for hygiene and checkup  Vaccinations: Influenza vaccine: Up to date Pneumococcal vaccine: Up to date Tdap vaccine: Pt declines today.  Shingles vaccine: Pt declines today.     Advanced directives: Already on file.  Conditions/risks identified: Recommend increasing water intake to 4-6 glasses a day.   Next appointment: 9:00 AM today with Dr Brita Romp. Pt declined set up of AWV for 2020.   Preventive Care 68 Years and Older, Female Preventive care refers to lifestyle choices and visits with your health care provider that can promote health and wellness. What does preventive care include?  A yearly physical exam. This is also called an annual well check.  Dental exams once or twice a year.  Routine eye exams. Ask your health care provider how often you should have your eyes checked.  Personal lifestyle choices, including:  Daily care of your teeth and gums.  Regular physical activity.  Eating a healthy diet.  Avoiding tobacco and drug use.  Limiting alcohol use.  Practicing safe sex.  Taking low-dose aspirin every day.  Taking vitamin and mineral supplements as recommended by your health care provider. What happens during an annual well check? The services and screenings done by your health care provider during your annual well check will depend on your age, overall health, lifestyle risk factors, and family history of disease. Counseling  Your health care provider may ask you questions about your:  Alcohol  use.  Tobacco use.  Drug use.  Emotional well-being.  Home and relationship well-being.  Sexual activity.  Eating habits.  History of falls.  Memory and ability to understand (cognition).  Work and work Statistician.  Reproductive health. Screening  You may have the following tests or measurements:  Height, weight, and BMI.  Blood pressure.  Lipid and cholesterol levels. These may be checked every 5 years, or more frequently if you are over 50 years old.  Skin check.  Lung cancer screening. You may have this screening every year starting at age 15 if you have a 30-pack-year history of smoking and currently smoke or have quit within the past 15 years.  Fecal occult blood test (FOBT) of the stool. You may have this test every year starting at age 45.  Flexible sigmoidoscopy or colonoscopy. You may have a sigmoidoscopy every 5 years or a colonoscopy every 10 years starting at age 31.  Hepatitis C blood test.  Hepatitis B blood test.  Sexually transmitted disease (STD) testing.  Diabetes screening. This is done by checking your blood sugar (glucose) after you have not eaten for a while (fasting). You may have this done every 1-3 years.  Bone density scan. This is done to screen for osteoporosis. You may have this done starting at age 9.  Mammogram. This may be done every 1-2 years. Talk to your health care provider about how often you should have regular mammograms. Talk with your health care provider about your test results, treatment options, and if necessary, the need for more tests. Vaccines  Your health care provider may recommend certain vaccines, such as:  Influenza vaccine.  This is recommended every year.  Tetanus, diphtheria, and acellular pertussis (Tdap, Td) vaccine. You may need a Td booster every 10 years.  Zoster vaccine. You may need this after age 67.  Pneumococcal 13-valent conjugate (PCV13) vaccine. One dose is recommended after age  71.  Pneumococcal polysaccharide (PPSV23) vaccine. One dose is recommended after age 86. Talk to your health care provider about which screenings and vaccines you need and how often you need them. This information is not intended to replace advice given to you by your health care provider. Make sure you discuss any questions you have with your health care provider. Document Released: 04/24/2015 Document Revised: 12/16/2015 Document Reviewed: 01/27/2015 Elsevier Interactive Patient Education  2017 [employer] Prevention in the Home Falls can cause injuries. They can happen to people of all ages. There are many things you can do to make your home safe and to help prevent falls. What can I do on the outside of my home?  Regularly fix the edges of walkways and driveways and fix any cracks.  Remove anything that might make you trip as you walk through a door, such as a raised step or threshold.  Trim any bushes or trees on the path to your home.  Use bright outdoor lighting.  Clear any walking paths of anything that might make someone trip, such as rocks or tools.  Regularly check to see if handrails are loose or broken. Make sure that both sides of any steps have handrails.  Any raised decks and porches should have guardrails on the edges.  Have any leaves, snow, or ice cleared regularly.  Use sand or salt on walking paths during winter.  Clean up any spills in your garage right away. This includes oil or grease spills. What can I do in the bathroom?  Use night lights.  Install grab bars by the toilet and in the tub and shower. Do not use towel bars as grab bars.  Use non-skid mats or decals in the tub or shower.  If you need to sit down in the shower, use a plastic, non-slip stool.  Keep the floor dry. Clean up any water that spills on the floor as soon as it happens.  Remove soap buildup in the tub or shower regularly.  Attach bath mats securely with double-sided  non-slip rug tape.  Do not have throw rugs and other things on the floor that can make you trip. What can I do in the bedroom?  Use night lights.  Make sure that you have a light by your bed that is easy to reach.  Do not use any sheets or blankets that are too big for your bed. They should not hang down onto the floor.  Have a firm chair that has side arms. You can use this for support while you get dressed.  Do not have throw rugs and other things on the floor that can make you trip. What can I do in the kitchen?  Clean up any spills right away.  Avoid walking on wet floors.  Keep items that you use a lot in easy-to-reach places.  If you need to reach something above you, use a strong step stool that has a grab bar.  Keep electrical cords out of the way.  Do not use floor polish or wax that makes floors slippery. If you must use wax, use non-skid floor wax.  Do not have throw rugs and other things on the floor that can make  you trip. What can I do with my stairs?  Do not leave any items on the stairs.  Make sure that there are handrails on both sides of the stairs and use them. Fix handrails that are broken or loose. Make sure that handrails are as long as the stairways.  Check any carpeting to make sure that it is firmly attached to the stairs. Fix any carpet that is loose or worn.  Avoid having throw rugs at the top or bottom of the stairs. If you do have throw rugs, attach them to the floor with carpet tape.  Make sure that you have a light switch at the top of the stairs and the bottom of the stairs. If you do not have them, ask someone to add them for you. What else can I do to help prevent falls?  Wear shoes that:  Do not have high heels.  Have rubber bottoms.  Are comfortable and fit you well.  Are closed at the toe. Do not wear sandals.  If you use a stepladder:  Make sure that it is fully opened. Do not climb a closed stepladder.  Make sure that both  sides of the stepladder are locked into place.  Ask someone to hold it for you, if possible.  Clearly mark and make sure that you can see:  Any grab bars or handrails.  First and last steps.  Where the edge of each step is.  Use tools that help you move around (mobility aids) if they are needed. These include:  Canes.  Walkers.  Scooters.  Crutches.  Turn on the lights when you go into a dark area. Replace any light bulbs as soon as they burn out.  Set up your furniture so you have a clear path. Avoid moving your furniture around.  If any of your floors are uneven, fix them.  If there are any pets around you, be aware of where they are.  Review your medicines with your doctor. Some medicines can make you feel dizzy. This can increase your chance of falling. Ask your doctor what other things that you can do to help prevent falls. This information is not intended to replace advice given to you by your health care provider. Make sure you discuss any questions you have with your health care provider. Document Released: 01/22/2009 Document Revised: 09/03/2015 Document Reviewed: 05/02/2014 Elsevier Interactive Patient Education  2017 [employer].

## 2017-07-04 NOTE — Assessment & Plan Note (Signed)
Classic presentation with physical exam findings for de Quervain's tenosynovitis of the right hand Discussed ice, bracing, regular NSAIDs for the next week at least Given information about stretches and hand exercises If not resolving with conservative measures, could consider referral to hand surgery for possible corticosteroid injection

## 2017-07-04 NOTE — Assessment & Plan Note (Signed)
Encourage patient to resume her Fosamax Discussed tips for remembering to take it weekly, such as an alarm on her phone or setting it by her coffee pot so that she sees it before she has anything to eat in the morning

## 2017-07-05 ENCOUNTER — Telehealth: Payer: Self-pay

## 2017-07-05 LAB — CBC
Hematocrit: 43.5 % (ref 34.0–46.6)
Hemoglobin: 14.1 g/dL (ref 11.1–15.9)
MCH: 31.2 pg (ref 26.6–33.0)
MCHC: 32.4 g/dL (ref 31.5–35.7)
MCV: 96 fL (ref 79–97)
PLATELETS: 318 10*3/uL (ref 150–379)
RBC: 4.52 x10E6/uL (ref 3.77–5.28)
RDW: 13 % (ref 12.3–15.4)
WBC: 6.4 10*3/uL (ref 3.4–10.8)

## 2017-07-05 LAB — COMPREHENSIVE METABOLIC PANEL
ALK PHOS: 79 IU/L (ref 39–117)
ALT: 14 IU/L (ref 0–32)
AST: 17 IU/L (ref 0–40)
Albumin/Globulin Ratio: 1.3 (ref 1.2–2.2)
Albumin: 4.3 g/dL (ref 3.5–4.8)
BUN/Creatinine Ratio: 26 (ref 12–28)
BUN: 19 mg/dL (ref 8–27)
Bilirubin Total: 0.5 mg/dL (ref 0.0–1.2)
CO2: 21 mmol/L (ref 20–29)
CREATININE: 0.72 mg/dL (ref 0.57–1.00)
Calcium: 9.7 mg/dL (ref 8.7–10.3)
Chloride: 104 mmol/L (ref 96–106)
GFR calc Af Amer: 97 mL/min/{1.73_m2} (ref >59)
GFR calc non Af Amer: 85 mL/min/{1.73_m2} (ref >59)
GLOBULIN, TOTAL: 3.4 g/dL (ref 1.5–4.5)
GLUCOSE: 94 mg/dL (ref 65–99)
Potassium: 4.9 mmol/L (ref 3.5–5.2)
SODIUM: 143 mmol/L (ref 134–144)
Total Protein: 7.7 g/dL (ref 6.0–8.5)

## 2017-07-05 LAB — TSH: TSH: 3.5 u[IU]/mL (ref 0.450–4.500)

## 2017-07-05 LAB — LIPID PANEL
CHOLESTEROL TOTAL: 208 mg/dL — AB (ref 100–199)
Chol/HDL Ratio: 3.2 ratio (ref 0.0–4.4)
HDL: 66 mg/dL (ref >39)
LDL CALC: 123 mg/dL — AB (ref 0–99)
TRIGLYCERIDES: 96 mg/dL (ref 0–149)
VLDL Cholesterol Cal: 19 mg/dL (ref 5–40)

## 2017-07-05 NOTE — Telephone Encounter (Signed)
-----   Message from Virginia Crews, MD sent at 07/05/2017  8:37 AM EDT ----- Normal kidney function, liver function, electrolytes, Thyroid function, Blood counts.  Cholesterol is slightly elevated, but does not require medication at this time.

## 2017-07-05 NOTE — Telephone Encounter (Signed)
Went straight to VM, which has not been set up yet. Will try again later.

## 2017-07-05 NOTE — Telephone Encounter (Signed)
Patient returned call. She states she viewed labs on mychart, but has some questions. She is requesting a call back.

## 2017-07-06 NOTE — Telephone Encounter (Signed)
Pt asked about glucose being high end of normal. Advised pt that glucose was the same last year, and we will continue to monitor this to see if it becomes elevated in the future. Also explained HDL vs. LDL. Pt acknowledges understanding of lab results.

## 2017-07-13 NOTE — Telephone Encounter (Signed)
complete

## 2017-07-20 ENCOUNTER — Other Ambulatory Visit: Payer: Self-pay | Admitting: Family Medicine

## 2017-07-20 DIAGNOSIS — Z1231 Encounter for screening mammogram for malignant neoplasm of breast: Secondary | ICD-10-CM

## 2017-07-28 ENCOUNTER — Telehealth: Payer: Self-pay | Admitting: Family Medicine

## 2017-07-28 NOTE — Telephone Encounter (Signed)
Pt states she is wanting to get the new shingles shot and she checked with her insurance and they only cover in pharmacy only.  [facility] needs you to send an RX into the pharmacy so they can give the patient the shot.

## 2017-07-31 NOTE — Telephone Encounter (Signed)
I think we should fax a paper rx of this to pharmacy. Agree to write rx?

## 2017-07-31 NOTE — Telephone Encounter (Signed)
We can fax over Rx  Carrel Leather, Dionne Bucy, MD, MPH [facility] 07/31/2017 8:28 AM

## 2017-08-02 NOTE — Telephone Encounter (Signed)
Faxed 2nd time, as first fax failed.

## 2017-08-02 NOTE — Telephone Encounter (Signed)
Received confirmation that this has been sent to pharmacy. Pt advised.

## 2017-08-22 ENCOUNTER — Ambulatory Visit
Admission: RE | Admit: 2017-08-22 | Discharge: 2017-08-22 | Disposition: A | Discharge status: Home / Self Care | Payer: PPO | Source: Ambulatory Visit | Attending: Family Medicine | Admitting: Family Medicine

## 2017-08-22 DIAGNOSIS — Z1231 Encounter for screening mammogram for malignant neoplasm of breast: Secondary | ICD-10-CM | POA: Insufficient documentation

## 2017-08-23 ENCOUNTER — Telehealth: Payer: Self-pay

## 2017-08-23 NOTE — Telephone Encounter (Signed)
-----   Message from Virginia Crews, MD sent at 08/23/2017  1:30 PM EDT ----- Normal mammogram.  Repeat in 1 year.  Virginia Crews, MD, MPH [facility] 08/23/2017 1:30 PM

## 2017-08-23 NOTE — Telephone Encounter (Signed)
Pt advised.

## 2017-09-15 DIAGNOSIS — D225 Melanocytic nevi of trunk: Secondary | ICD-10-CM | POA: Diagnosis not present

## 2017-09-15 DIAGNOSIS — D2272 Melanocytic nevi of left lower limb, including hip: Secondary | ICD-10-CM | POA: Diagnosis not present

## 2017-09-15 DIAGNOSIS — D2262 Melanocytic nevi of left upper limb, including shoulder: Secondary | ICD-10-CM | POA: Diagnosis not present

## 2017-09-15 DIAGNOSIS — D2271 Melanocytic nevi of right lower limb, including hip: Secondary | ICD-10-CM | POA: Diagnosis not present

## 2017-09-15 DIAGNOSIS — D2261 Melanocytic nevi of right upper limb, including shoulder: Secondary | ICD-10-CM | POA: Diagnosis not present

## 2017-09-15 DIAGNOSIS — L738 Other specified follicular disorders: Secondary | ICD-10-CM | POA: Diagnosis not present

## 2017-09-21 DIAGNOSIS — H40153 Residual stage of open-angle glaucoma, bilateral: Secondary | ICD-10-CM | POA: Diagnosis not present

## 2018-02-05 ENCOUNTER — Other Ambulatory Visit: Payer: Self-pay | Admitting: Physician Assistant

## 2018-02-05 DIAGNOSIS — E7800 Pure hypercholesterolemia, unspecified: Secondary | ICD-10-CM

## 2018-07-09 ENCOUNTER — Other Ambulatory Visit: Payer: Self-pay | Admitting: Family Medicine

## 2018-07-09 DIAGNOSIS — E7800 Pure hypercholesterolemia, unspecified: Secondary | ICD-10-CM

## 2018-10-10 ENCOUNTER — Other Ambulatory Visit: Payer: Self-pay

## 2018-10-10 ENCOUNTER — Ambulatory Visit (INDEPENDENT_AMBULATORY_CARE_PROVIDER_SITE_OTHER): Payer: PPO | Admitting: Physician Assistant

## 2018-10-10 ENCOUNTER — Encounter: Payer: Self-pay | Admitting: Physician Assistant

## 2018-10-10 VITALS — BP 128/74 | HR 66 | Temp 97.9°F | Resp 16 | Ht 63.0 in | Wt 133.0 lb

## 2018-10-10 DIAGNOSIS — Z0000 Encounter for general adult medical examination without abnormal findings: Secondary | ICD-10-CM

## 2018-10-10 DIAGNOSIS — M810 Age-related osteoporosis without current pathological fracture: Secondary | ICD-10-CM | POA: Diagnosis not present

## 2018-10-10 DIAGNOSIS — E7800 Pure hypercholesterolemia, unspecified: Secondary | ICD-10-CM | POA: Diagnosis not present

## 2018-10-10 DIAGNOSIS — Z1239 Encounter for other screening for malignant neoplasm of breast: Secondary | ICD-10-CM

## 2018-10-10 NOTE — Patient Instructions (Signed)
Health Maintenance, Female Adopting a healthy lifestyle and getting preventive care are important in promoting health and wellness. Ask your health care provider about:  The right schedule for you to have regular tests and exams.  Things you can do on your own to prevent diseases and keep yourself healthy. What should I know about diet, weight, and exercise? Eat a healthy diet   Eat a diet that includes plenty of vegetables, fruits, low-fat dairy products, and lean protein.  Do not eat a lot of foods that are high in solid fats, added sugars, or sodium. Maintain a healthy weight Body mass index (BMI) is used to identify weight problems. It estimates body fat based on height and weight. Your health care provider can help determine your BMI and help you achieve or maintain a healthy weight. Get regular exercise Get regular exercise. This is one of the most important things you can do for your health. Most adults should:  Exercise for at least 150 minutes each week. The exercise should increase your heart rate and make you sweat (moderate-intensity exercise).  Do strengthening exercises at least twice a week. This is in addition to the moderate-intensity exercise.  Spend less time sitting. Even light physical activity can be beneficial. Watch cholesterol and blood lipids Have your blood tested for lipids and cholesterol at 73 years of age, then have this test every 5 years. Have your cholesterol levels checked more often if:  Your lipid or cholesterol levels are high.  You are older than 73 years of age.  You are at high risk for heart disease. What should I know about cancer screening? Depending on your health history and family history, you may need to have cancer screening at various ages. This may include screening for:  Breast cancer.  Cervical cancer.  Colorectal cancer.  Skin cancer.  Lung cancer. What should I know about heart disease, diabetes, and high blood  pressure? Blood pressure and heart disease  High blood pressure causes heart disease and increases the risk of stroke. This is more likely to develop in people who have high blood pressure readings, are of African descent, or are overweight.  Have your blood pressure checked: ? Every 3-5 years if you are 18-39 years of age. ? Every year if you are 40 years old or older. Diabetes Have regular diabetes screenings. This checks your fasting blood sugar level. Have the screening done:  Once every three years after age 40 if you are at a normal weight and have a low risk for diabetes.  More often and at a younger age if you are overweight or have a high risk for diabetes. What should I know about preventing infection? Hepatitis B If you have a higher risk for hepatitis B, you should be screened for this virus. Talk with your health care provider to find out if you are at risk for hepatitis B infection. Hepatitis C Testing is recommended for:  Everyone born from 1945 through 1965.  Anyone with known risk factors for hepatitis C. Sexually transmitted infections (STIs)  Get screened for STIs, including gonorrhea and chlamydia, if: ? You are sexually active and are younger than 73 years of age. ? You are older than 73 years of age and your health care provider tells you that you are at risk for this type of infection. ? Your sexual activity has changed since you were last screened, and you are at increased risk for chlamydia or gonorrhea. Ask your health care provider if   you are at risk.  Ask your health care provider about whether you are at high risk for HIV. Your health care provider may recommend a prescription medicine to help prevent HIV infection. If you choose to take medicine to prevent HIV, you should first get tested for HIV. You should then be tested every 3 months for as long as you are taking the medicine. Pregnancy  If you are about to stop having your period (premenopausal) and  you may become pregnant, seek counseling before you get pregnant.  Take 400 to 800 micrograms (mcg) of folic acid every day if you become pregnant.  Ask for birth control (contraception) if you want to prevent pregnancy. Osteoporosis and menopause Osteoporosis is a disease in which the bones lose minerals and strength with aging. This can result in bone fractures. If you are 65 years old or older, or if you are at risk for osteoporosis and fractures, ask your health care provider if you should:  Be screened for bone loss.  Take a calcium or vitamin D supplement to lower your risk of fractures.  Be given hormone replacement therapy (HRT) to treat symptoms of menopause. Follow these instructions at home: Lifestyle  Do not use any products that contain nicotine or tobacco, such as cigarettes, e-cigarettes, and chewing tobacco. If you need help quitting, ask your health care provider.  Do not use street drugs.  Do not share needles.  Ask your health care provider for help if you need support or information about quitting drugs. Alcohol use  Do not drink alcohol if: ? Your health care provider tells you not to drink. ? You are pregnant, may be pregnant, or are planning to become pregnant.  If you drink alcohol: ? Limit how much you use to 0-1 drink a day. ? Limit intake if you are breastfeeding.  Be aware of how much alcohol is in your drink. In the [place] one drink equals one 12 oz bottle of beer (355 mL), one 5 oz glass of wine (148 mL), or one 1 oz glass of hard liquor (44 mL). General instructions  Schedule regular health, dental, and eye exams.  Stay current with your vaccines.  Tell your health care provider if: ? You often feel depressed. ? You have ever been abused or do not feel safe at home. Summary  Adopting a healthy lifestyle and getting preventive care are important in promoting health and wellness.  Follow your health care provider's instructions about healthy  diet, exercising, and getting tested or screened for diseases.  Follow your health care provider's instructions on monitoring your cholesterol and blood pressure. This information is not intended to replace advice given to you by your health care provider. Make sure you discuss any questions you have with your health care provider. Document Released: 10/11/2010 Document Revised: 03/21/2018 Document Reviewed: 03/21/2018 Elsevier Patient Education  2020 [employer]  

## 2018-10-10 NOTE — Progress Notes (Signed)
.       Patient: Amanda Walton, Female    DOB: 04/29/1945, 73 y.o.   MRN: [id number] Visit Date: 10/10/2018  Today's Provider: Trinna Post, PA-C   Chief Complaint  Patient presents with  . Medicare Wellness   Subjective:     Annual wellness visit Amanda Walton is a 73 y.o. female. She feels well. She reports exercising yes. She reports she is sleeping fairly well.  Mammo: 08/2017 Colonoscopy: 2016 normal  PAP: not indicated DXA: osteoporosis 2018, due this year. Hasn't taken fosamax in months due to inconvenience of dosing.   Continues to volunteer at [employer], though this has been on hold due to [place]. Has one great gandchild and another on the way.   HLD: Takes simvastatin 10 mg without issue.   Lipid Panel     Component Value Date/Time   CHOL 208 (H) 07/04/2017 0951   TRIG 96 07/04/2017 0951   HDL 66 07/04/2017 0951   CHOLHDL 3.2 07/04/2017 0951   LDLCALC 123 (H) 07/04/2017 0951    -----------------------------------------------------------   Review of Systems  All other systems reviewed and are negative.   Social History   Socioeconomic History  . Marital status: Married    Spouse name: Not on file  . Number of children: 2  . Years of education: Not on file  . Highest education level: Associate degree: occupational, Hotel manager, or vocational program  Occupational History  . Occupation: retired    Comment: does some part time work at [employer]  . Financial resource strain: Not hard at all  . Food insecurity    Worry: Never true    Inability: Never true  . Transportation needs    Medical: No    Non-medical: No  Tobacco Use  . Smoking status: Former Smoker    Quit date: 04/10/1989    Years since quitting: 29.5  . Smokeless tobacco: Never Used  Substance and Sexual Activity  . Alcohol use: Yes    Alcohol/week: 9.0 standard drinks    Types: 9 Glasses of wine per week    Comment: 1 glass M-F, a couple on the weekend  . Drug use:  No  . Sexual activity: Not on file  Lifestyle  . Physical activity    Days per week: Not on file    Minutes per session: Not on file  . Stress: Not at all  Relationships  . Social Herbalist on phone: Not on file    Gets together: Not on file    Attends religious service: Not on file    Active member of club or organization: Not on file    Attends meetings of clubs or organizations: Not on file    Relationship status: Not on file  . Intimate partner violence    Fear of current or ex partner: Not on file    Emotionally abused: Not on file    Physically abused: Not on file    Forced sexual activity: Not on file  Other Topics Concern  . Not on file  Social History Narrative  . Not on file    Past Medical History:  Diagnosis Date  . Arthritis      Patient Active Problem List   Diagnosis Date Noted  . De Quervain's tenosynovitis, right 07/04/2017  . S/P right THA, AA 08/04/2015  . Arthritis 05/20/2015  . Blood in the urine 05/20/2015  . Hypercholesteremia 01/21/2009  . Adult hypothyroidism 07/26/2007  . OP (osteoporosis)  07/13/2006  . Displacement of lumbar intervertebral disc without myelopathy 11/13/2004    Past Surgical History:  Procedure Laterality Date  . TONSILLECTOMY AND ADENOIDECTOMY    . TOTAL HIP ARTHROPLASTY Right 08/04/2015   Procedure: RIGHT TOTAL HIP ARTHROPLASTY ANTERIOR APPROACH;  Surgeon: Paralee Cancel, MD;  Location: [facility];  Service: Orthopedics;  Laterality: Right;  . TUBAL LIGATION      Her family history includes Dementia in her mother; Heart disease in her father; Hyperlipidemia in her sister; Hypertension in her father; Pancreatic cancer in her sister. There is no history of Breast cancer.   Current Outpatient Medications:  .  aspirin (ASPIRIN LOW DOSE) 81 MG EC tablet, Take 81 mg by mouth daily. Swallow whole., Disp: , Rfl:  .  CALCIUM CARBONATE-VITAMIN D PO, Take 2 tablets by mouth daily. , Disp: , Rfl:  .  FIBER COMPLETE PO, Take  2 capsules by mouth daily. , Disp: , Rfl:  .  simvastatin (ZOCOR) 10 MG tablet, TAKE 1 TABLET BY MOUTH AT BEDTIME, Disp: 30 tablet, Rfl: 5 .  alendronate (FOSAMAX) 70 MG tablet, Take 1 tablet once weekly w/ full glass of water on an empty stomach. No other meds within 30 min of taking this, sit upright 30 min after. (Patient not taking: Reported on 07/04/2017), Disp: 12 tablet, Rfl: 0 .  conjugated estrogens (PREMARIN) vaginal cream, Place 1 Applicatorful 2 (two) times a week vaginally. (Patient not taking: Reported on 07/04/2017), Disp: 42.5 g, Rfl: 12  Patient Care Team: Virginia Crews, MD as PCP - General (Family Medicine) Rubie Maid, MD as Referring Physician (Obstetrics and Gynecology) Dasher, Rayvon Char, MD as Consulting Physician (Dermatology) Lorelee Cover., MD as Consulting Physician (Ophthalmology)    Objective:    Vitals: BP 128/74 (BP Location: Left Arm, Patient Position: Sitting, Cuff Size: Normal)   Pulse 66   Temp 97.9 F (36.6 C) (Oral)   Resp 16   Ht 5\' 3"  (1.6 m)   Wt 133 lb (60.3 kg)   SpO2 97%   BMI 23.56 kg/m   Physical Exam Constitutional:      Appearance: Normal appearance. She is normal weight.  HENT:     Right Ear: Tympanic membrane and ear canal normal.     Left Ear: Tympanic membrane and ear canal normal.  Cardiovascular:     Rate and Rhythm: Normal rate and regular rhythm.     Heart sounds: Normal heart sounds.  Pulmonary:     Effort: Pulmonary effort is normal.     Breath sounds: Normal breath sounds.  Abdominal:     General: Bowel sounds are normal. There is no distension.     Palpations: Abdomen is soft.     Tenderness: There is no abdominal tenderness.  Skin:    General: Skin is warm and dry.  Neurological:     Mental Status: She is alert and oriented to person, place, and time. Mental status is at baseline.  Psychiatric:        Mood and Affect: Mood normal.        Behavior: Behavior normal.     Activities of Daily Living In  your present state of health, do you have any difficulty performing the following activities: 10/10/2018  Hearing? N  Vision? N  Difficulty concentrating or making decisions? N  Walking or climbing stairs? N  Dressing or bathing? N  Doing errands, shopping? N  Some recent data might be hidden    Fall Risk Assessment Fall Risk  10/10/2018 07/04/2017 07/06/2016 07/01/2015  Falls in the past year? 0 No No No  Number falls in past yr: 0 - - -     Depression Screen PHQ 2/9 Scores 10/10/2018 08/15/2018 08/15/2018 08/15/2018  PHQ - 2 Score 0 0 0 0  PHQ- 9 Score 0 - - -    Cognitive Testing - 6-CIT  Correct? Score   What year is it? yes 0 0 or 4  What month is it? yes 0 0 or 3  Memorize:    Pia Mau,  42,  [place],      What time is it? (within 1 hour) yes 0 0 or 3  Count backwards from 20 yes 0 0, 2, or 4  Name the months of the year yes 0 0, 2, or 4  Repeat name & address above yes 0 0, 2, 4, 6, 8, or 10       TOTAL SCORE  0/28   Interpretation:  Normal  Normal (0-7) Abnormal (8-28)     6CIT Screen 07/04/2017  What Year? 0 points  What month? 0 points  What time? 0 points  Count back from 20 0 points  Months in reverse 0 points  Repeat phrase 0 points  Total Score 0      Assessment & Plan:     Annual Wellness Visit  Reviewed patient's Family Medical History Reviewed and updated list of patient's medical providers Assessment of cognitive impairment was done Assessed patient's functional ability Established a written schedule for health screening [place] Completed and Reviewed  Exercise Activities and Dietary recommendations Goals    . DIET - INCREASE WATER INTAKE     Recommend increasing water intake to 4-6 glasses a day.        Immunization History  Administered Date(s) Administered  . Influenza Split 01/16/2009, 01/22/2010, 03/21/2012  . Influenza, High Dose Seasonal PF 04/23/2014, 04/20/2017  . Influenza,inj,Quad PF,6+ Mos  03/27/2013  . Pneumococcal Conjugate-13 04/23/2014  . Pneumococcal Polysaccharide-23 03/18/2011  . Tdap 07/06/2005  . Zoster 01/16/2009    Health Maintenance  Topic Date Due  . Samul Dada  07/07/2015  . INFLUENZA VACCINE  11/10/2018  . MAMMOGRAM  08/23/2019  . COLONOSCOPY  07/23/2024  . DEXA SCAN  Completed  . Hepatitis C Screening  Completed  . PNA vac Low Risk Adult  Completed     Discussed health benefits of physical activity, and encouraged her to engage in regular exercise appropriate for her age and condition.    1. Annual physical exam   2. Hypercholesteremia  Labs today. Adjust pending labwork. Continue simvastatin.  3. Breast cancer screening  Mammogram ordered. Contact card provided.   4. Age related osteoporosis, unspecified pathological fracture presence  Counseled she doesn't have to remain seated after taking Fosamax, she just has to remain upright. She will try to be more consistent. Due for DEXA this year and I have ordered this.   The entirety of the information documented in the History of Present Illness, Review of Systems and Physical Exam were personally obtained by me. Portions of this information were initially documented by April M. Sabra Heck, CMA and reviewed by me for thoroughness and accuracy.   F/u 1 year CPE  --------------------------------------------------------------------------     Trinna Post, PA-C  [facility]

## 2018-10-11 ENCOUNTER — Telehealth: Payer: Self-pay

## 2018-10-11 ENCOUNTER — Telehealth: Payer: Self-pay | Admitting: Family Medicine

## 2018-10-11 DIAGNOSIS — E7800 Pure hypercholesterolemia, unspecified: Secondary | ICD-10-CM

## 2018-10-11 LAB — COMPREHENSIVE METABOLIC PANEL
ALT: 12 IU/L (ref 0–32)
AST: 19 IU/L (ref 0–40)
Albumin/Globulin Ratio: 1.6 (ref 1.2–2.2)
Albumin: 4.5 g/dL (ref 3.7–4.7)
Alkaline Phosphatase: 79 IU/L (ref 39–117)
BUN/Creatinine Ratio: 26 (ref 12–28)
BUN: 18 mg/dL (ref 8–27)
Bilirubin Total: 0.4 mg/dL (ref 0.0–1.2)
CO2: 23 mmol/L (ref 20–29)
Calcium: 9.6 mg/dL (ref 8.7–10.3)
Chloride: 102 mmol/L (ref 96–106)
Creatinine, Ser: 0.7 mg/dL (ref 0.57–1.00)
GFR calc Af Amer: 100 mL/min/{1.73_m2} (ref >59)
GFR calc non Af Amer: 87 mL/min/{1.73_m2} (ref >59)
Globulin, Total: 2.8 g/dL (ref 1.5–4.5)
Glucose: 90 mg/dL (ref 65–99)
Potassium: 4.7 mmol/L (ref 3.5–5.2)
Sodium: 141 mmol/L (ref 134–144)
Total Protein: 7.3 g/dL (ref 6.0–8.5)

## 2018-10-11 LAB — CBC WITH DIFFERENTIAL/PLATELET
Basophils Absolute: 0.1 10*3/uL (ref 0.0–0.2)
Basos: 1 %
EOS (ABSOLUTE): 0.1 10*3/uL (ref 0.0–0.4)
Eos: 2 %
Hematocrit: 45.6 % (ref 34.0–46.6)
Hemoglobin: 14.5 g/dL (ref 11.1–15.9)
Immature Grans (Abs): 0 10*3/uL (ref 0.0–0.1)
Immature Granulocytes: 0 %
Lymphocytes Absolute: 1.3 10*3/uL (ref 0.7–3.1)
Lymphs: 20 %
MCH: 30.9 pg (ref 26.6–33.0)
MCHC: 31.8 g/dL (ref 31.5–35.7)
MCV: 97 fL (ref 79–97)
Monocytes Absolute: 0.7 10*3/uL (ref 0.1–0.9)
Monocytes: 10 %
Neutrophils Absolute: 4.4 10*3/uL (ref 1.4–7.0)
Neutrophils: 67 %
Platelets: 296 10*3/uL (ref 150–450)
RBC: 4.7 x10E6/uL (ref 3.77–5.28)
RDW: 12 % (ref 11.7–15.4)
WBC: 6.6 10*3/uL (ref 3.4–10.8)

## 2018-10-11 LAB — LIPID PANEL
Chol/HDL Ratio: 3.3 ratio (ref 0.0–4.4)
Cholesterol, Total: 213 mg/dL — ABNORMAL HIGH (ref 100–199)
HDL: 65 mg/dL (ref >39)
LDL Calculated: 126 mg/dL — ABNORMAL HIGH (ref 0–99)
Triglycerides: 109 mg/dL (ref 0–149)
VLDL Cholesterol Cal: 22 mg/dL (ref 5–40)

## 2018-10-11 LAB — TSH: TSH: 3.99 u[IU]/mL (ref 0.450–4.500)

## 2018-10-11 MED ORDER — SIMVASTATIN 20 MG PO TABS: 20.0000 mg | ORAL_TABLET | Freq: Every day | ORAL | 0 refills | Status: DC

## 2018-10-11 NOTE — Telephone Encounter (Signed)
-----   Message from Trinna Post, [place] sent at 10/11/2018  2:49 PM EDT ----- Labs stable.

## 2018-10-11 NOTE — Telephone Encounter (Signed)
No, 20 is the new correct dose. Thanks!

## 2018-10-11 NOTE — Telephone Encounter (Signed)
Pt has seen her labs in My Chart but would like someone to call her back  CB#  [phone]  Thanks teri

## 2018-10-11 NOTE — Telephone Encounter (Signed)
Patient was notified of results. Expressed understanding. Patient is willing to increase statin. Patient stated if the increase is going to to 40 mg, she just got a new rx of the 20 mg she can double up until finished. Just let her know. Thanks!

## 2018-10-15 DIAGNOSIS — L814 Other melanin hyperpigmentation: Secondary | ICD-10-CM | POA: Diagnosis not present

## 2018-10-15 DIAGNOSIS — D692 Other nonthrombocytopenic purpura: Secondary | ICD-10-CM | POA: Diagnosis not present

## 2018-10-15 NOTE — Telephone Encounter (Signed)
Patient advised of new correct dose. Patient to take 20 mg once a day.

## 2018-11-21 ENCOUNTER — Ambulatory Visit
Admission: RE | Admit: 2018-11-21 | Discharge: 2018-11-21 | Disposition: A | Discharge status: Home / Self Care | Payer: PPO | Source: Ambulatory Visit | Attending: Physician Assistant | Admitting: Physician Assistant

## 2018-11-21 ENCOUNTER — Other Ambulatory Visit: Payer: Self-pay

## 2018-11-21 DIAGNOSIS — Z1239 Encounter for other screening for malignant neoplasm of breast: Secondary | ICD-10-CM

## 2018-11-21 DIAGNOSIS — M810 Age-related osteoporosis without current pathological fracture: Secondary | ICD-10-CM | POA: Insufficient documentation

## 2018-11-21 DIAGNOSIS — Z1231 Encounter for screening mammogram for malignant neoplasm of breast: Secondary | ICD-10-CM | POA: Insufficient documentation

## 2018-11-21 DIAGNOSIS — M85852 Other specified disorders of bone density and structure, left thigh: Secondary | ICD-10-CM | POA: Diagnosis not present

## 2018-11-21 DIAGNOSIS — Z780 Asymptomatic menopausal state: Secondary | ICD-10-CM | POA: Diagnosis not present

## 2018-12-29 ENCOUNTER — Other Ambulatory Visit: Payer: Self-pay | Admitting: Physician Assistant

## 2018-12-29 DIAGNOSIS — E7800 Pure hypercholesterolemia, unspecified: Secondary | ICD-10-CM

## 2018-12-31 ENCOUNTER — Telehealth: Payer: Self-pay | Admitting: Family Medicine

## 2018-12-31 DIAGNOSIS — M8580 Other specified disorders of bone density and structure, unspecified site: Secondary | ICD-10-CM

## 2018-12-31 DIAGNOSIS — Z9189 Other specified personal risk factors, not elsewhere classified: Secondary | ICD-10-CM

## 2018-12-31 NOTE — Telephone Encounter (Signed)
I was going to send this in since she just had CPE and BMD.  But, med list says not taking

## 2018-12-31 NOTE — Telephone Encounter (Signed)
[facility] is calling for pt. She is needing a refill on Alendronate 70mg .   Thanks, [employer]

## 2019-01-01 MED ORDER — ALENDRONATE SODIUM 70 MG PO TABS: ORAL_TABLET | ORAL | 0 refills | Status: DC

## 2019-01-01 NOTE — Telephone Encounter (Signed)
Rx sent 

## 2019-01-30 ENCOUNTER — Other Ambulatory Visit: Payer: Self-pay

## 2019-01-30 ENCOUNTER — Ambulatory Visit (INDEPENDENT_AMBULATORY_CARE_PROVIDER_SITE_OTHER): Payer: PPO

## 2019-01-30 DIAGNOSIS — Z23 Encounter for immunization: Secondary | ICD-10-CM

## 2019-02-21 DIAGNOSIS — H40153 Residual stage of open-angle glaucoma, bilateral: Secondary | ICD-10-CM | POA: Diagnosis not present

## 2019-02-26 ENCOUNTER — Other Ambulatory Visit: Payer: Self-pay

## 2019-02-26 DIAGNOSIS — Z20822 Contact with and (suspected) exposure to covid-19: Secondary | ICD-10-CM

## 2019-02-28 LAB — NOVEL CORONAVIRUS, NAA: SARS-CoV-2, NAA: NOT DETECTED

## 2019-03-12 NOTE — Progress Notes (Signed)
Subjective:   Amanda Walton is a 73 y.o. female who presents for Medicare Annual (Subsequent) preventive examination.    This visit is being conducted through telemedicine due to the COVID-19 pandemic. This patient has given me verbal consent via doximity to conduct this visit, patient states they are participating from their home address. Some vital signs may be absent or patient reported.    Patient identification: identified by name, DOB, and current address  Review of Systems:  N/A  Cardiac Risk Factors include: advanced age (>31men, >21 women);dyslipidemia     Objective:     Vitals: There were no vitals taken for this visit.  There is no height or weight on file to calculate BMI. Unable to obtain vitals due to visit being conducted via telephonically.   Advanced Directives 03/13/2019 07/04/2017 07/06/2016 03/26/2016 08/04/2015 07/22/2015 07/01/2015  Does Patient Have a Medical Advance Directive? Yes Yes Yes Yes Yes Yes No  Type of Paramedic of [place];Living will [place];Living will [place];Living will Living will [place];Living will Living will;Healthcare Power of Attorney -  Does patient want to make changes to medical advance directive? - - - - No - Patient declined No - Patient declined -  Copy of [place] in Chart? Yes - validated most recent copy scanned in chart (See row information) Yes - - No - copy requested No - copy requested -    Tobacco Social History   Tobacco Use  Smoking Status Former Smoker  . Quit date: 04/10/1989  . Years since quitting: 29.9  Smokeless Tobacco Never Used     Counseling given: Not Answered   Clinical Intake:  Pre-visit preparation completed: Yes  Pain : No/denies pain Pain Score: 0-No pain     Nutritional Risks: None Diabetes: No  How often do you need to have someone help you when you read instructions, pamphlets, or  other written materials from your doctor or pharmacy?: 1 - Never  Interpreter Needed?: No  Information entered by :: [facility], LPN  Past Medical History:  Diagnosis Date  . Arthritis    Past Surgical History:  Procedure Laterality Date  . TONSILLECTOMY AND ADENOIDECTOMY    . TOTAL HIP ARTHROPLASTY Right 08/04/2015   Procedure: RIGHT TOTAL HIP ARTHROPLASTY ANTERIOR APPROACH;  Surgeon: Paralee Cancel, MD;  Location: [facility];  Service: Orthopedics;  Laterality: Right;  . TUBAL LIGATION     Family History  Problem Relation Age of Onset  . Dementia Mother   . Hypertension Father   . Heart disease Father   . Hyperlipidemia Sister   . Pancreatic cancer Sister   . Breast cancer Neg Hx    Social History   Socioeconomic History  . Marital status: Married    Spouse name: Not on file  . Number of children: 2  . Years of education: Not on file  . Highest education level: Associate degree: occupational, Hotel manager, or vocational program  Occupational History  . Occupation: retired    Comment: does some part time work at [employer]  . Financial resource strain: Not hard at all  . Food insecurity    Worry: Never true    Inability: Never true  . Transportation needs    Medical: No    Non-medical: No  Tobacco Use  . Smoking status: Former Smoker    Quit date: 04/10/1989    Years since quitting: 29.9  . Smokeless tobacco: Never Used  Substance and Sexual Activity  . Alcohol use: Yes    Alcohol/week: 9.0 standard drinks    Types: 9 Glasses of wine per week    Comment: 1 glass M-F, a couple on the weekend  . Drug use: No  . Sexual activity: Not on file  Lifestyle  . Physical activity    Days per week: 7 days    Minutes per session: 20 min  . Stress: Only a little  Relationships  . Social Herbalist on phone: Patient refused    Gets together: Patient refused    Attends religious service: Patient refused    Active member of club or organization: Patient  refused    Attends meetings of clubs or organizations: Patient refused    Relationship status: Patient refused  Other Topics Concern  . Not on file  Social History Narrative  . Not on file    Outpatient Encounter Medications as of 03/13/2019  Medication Sig  . alendronate (FOSAMAX) 70 MG tablet Take 1 tablet once weekly w/ full glass of water on an empty stomach. No other meds within 30 min of taking this, sit upright 30 min after.  Marland Kitchen aspirin (ASPIRIN LOW DOSE) 81 MG EC tablet Take 81 mg by mouth daily. occasionally  . CALCIUM CARBONATE-VITAMIN D PO Take 2 tablets by mouth daily.   Marland Kitchen FIBER COMPLETE PO Take 2 capsules by mouth daily.   . simvastatin (ZOCOR) 20 MG tablet TAKE 1 TABLET BY MOUTH AT BEDTIME  . conjugated estrogens (PREMARIN) vaginal cream Place 1 Applicatorful 2 (two) times a week vaginally. (Patient not taking: Reported on 07/04/2017)   No facility-administered encounter medications on file as of 03/13/2019.     Activities of Daily Living In your present state of health, do you have any difficulty performing the following activities: 03/13/2019 10/10/2018  Hearing? N N  Vision? N N  Difficulty concentrating or making decisions? N N  Walking or climbing stairs? N N  Dressing or bathing? N N  Doing errands, shopping? N N  Preparing Food and eating ? N -  Using the Toilet? N -  In the past six months, have you accidently leaked urine? N -  Do you have problems with loss of bowel control? N -  Managing your Medications? N -  Managing your Finances? N -  Housekeeping or managing your Housekeeping? N -  Some recent data might be hidden    Patient Care Team: Brita Romp Dionne Bucy, MD as PCP - General (Family Medicine) Rubie Maid, MD as Referring Physician (Obstetrics and Gynecology) Dasher, Rayvon Char, MD as Consulting Physician (Dermatology) Lorelee Cover., MD as Consulting Physician (Ophthalmology)    Assessment:   This is a routine wellness examination for [facility].   Exercise Activities and Dietary recommendations Current Exercise Habits: Home exercise routine, Type of exercise: Other - see comments(rides a stationary bike), Time (Minutes): 20, Frequency (Times/Week): 7, Weekly Exercise (Minutes/Week): 140, Intensity: Mild, Exercise limited by: None identified  Goals    . DIET - INCREASE WATER INTAKE     Recommend increasing water intake to 4-6 glasses a day.        Fall Risk: Fall Risk  03/13/2019 10/10/2018 07/04/2017 07/06/2016 07/01/2015  Falls in the past year? 0 0 No No No  Number falls in past yr: 0 0 - - -  Injury with Fall? 0 - - - -    FALL RISK PREVENTION PERTAINING TO THE HOME:  Any stairs in or around  the home? Yes  If so, are there any without handrails? No   Home free of loose throw rugs in walkways, pet beds, electrical cords, etc? Yes  Adequate lighting in your home to reduce risk of falls? Yes   ASSISTIVE DEVICES UTILIZED TO PREVENT FALLS:  Life alert? No  Use of a cane, walker or w/c? No  Grab bars in the bathroom? Yes  Shower chair or bench in shower? Yes  Elevated toilet seat or a handicapped toilet? Yes    TIMED UP AND GO:  Was the test performed? No .    Depression Screen PHQ 2/9 Scores 03/13/2019 10/10/2018 08/15/2018 08/15/2018  PHQ - 2 Score 1 0 0 0  PHQ- 9 Score - 0 - -     Cognitive Function     6CIT Screen 03/13/2019 07/04/2017  What Year? 0 points 0 points  What month? 0 points 0 points  What time? 0 points 0 points  Count back from 20 0 points 0 points  Months in reverse 0 points 0 points  Repeat phrase 0 points 0 points  Total Score 0 0    Immunization History  Administered Date(s) Administered  . Fluad Quad(high Dose 65+) 01/30/2019  . Influenza Split 01/16/2009, 01/22/2010, 03/21/2012  . Influenza, High Dose Seasonal PF 04/23/2014, 04/20/2017, 02/28/2018  . Influenza,inj,Quad PF,6+ Mos 03/27/2013  . Pneumococcal Conjugate-13 04/23/2014  . Pneumococcal Polysaccharide-23 03/18/2011  . Tdap  07/06/2005  . Zoster 01/16/2009    Qualifies for Shingles Vaccine? Yes  Zostavax completed 01/16/09. Due for Shingrix. Pt has been advised to call insurance company to determine out of pocket expense. Advised may also receive vaccine at local pharmacy or Health Dept. Verbalized acceptance and understanding.  Tdap: Although this vaccine is not a covered service during a Wellness Exam, does the patient still wish to receive this vaccine today?  No .   Flu Vaccine: Up to date  Pneumococcal Vaccine: Completed series  Screening Tests Health Maintenance  Topic Date Due  . TETANUS/TDAP  03/12/2020 (Originally 07/07/2015)  . MAMMOGRAM  11/20/2020  . DEXA SCAN  11/20/2020  . COLONOSCOPY  07/23/2024  . INFLUENZA VACCINE  Completed  . Hepatitis C Screening  Completed  . PNA vac Low Risk Adult  Completed    Cancer Screenings:  Colorectal Screening: Completed 07/24/14. Repeat every 10 years.   Mammogram: Completed 11/21/18.  Bone Density: Completed 11/21/18. Results reflect OSTEOPOROSIS. Repeat every 2 years.   Lung Cancer Screening: (Low Dose CT Chest recommended if Age 75-80 years, 30 pack-year currently smoking OR have quit w/in 15years.) does not qualify.   Additional Screening:  Hepatitis C Screening: Up to date  Vision Screening: Recommended annual ophthalmology exams for early detection of glaucoma and other disorders of the eye.  Dental Screening: Recommended annual dental exams for proper oral hygiene  Community Resource Referral:  CRR required this visit?  No       Plan:  I have personally reviewed and addressed the Medicare Annual Wellness questionnaire and have noted the following in the patient's chart:  A. Medical and social history B. Use of alcohol, tobacco or illicit drugs  C. Current medications and supplements D. Functional ability and status E.  Nutritional status F.  Physical activity G. Advance directives H. List of other physicians I.  Hospitalizations,  surgeries, and ER visits in previous 12 months J.  [place] such as hearing and vision if needed, cognitive and depression L. Referrals and appointments   In addition, I  have reviewed and discussed with patient certain preventive protocols, quality metrics, and best practice recommendations. A written personalized care plan for preventive services as well as general preventive health recommendations were provided to patient. Nurse Health Advisor  Signed,    Ralynn [place], [place]  [id number] Nurse Health Advisor   Nurse Notes: None.

## 2019-03-13 ENCOUNTER — Other Ambulatory Visit: Payer: Self-pay

## 2019-03-13 ENCOUNTER — Ambulatory Visit (INDEPENDENT_AMBULATORY_CARE_PROVIDER_SITE_OTHER): Payer: PPO

## 2019-03-13 DIAGNOSIS — Z0000 Encounter for general adult medical examination without abnormal findings: Secondary | ICD-10-CM

## 2019-03-13 NOTE — Patient Instructions (Signed)
Amanda Walton , Thank you for taking time to come for your Medicare Wellness Visit. I appreciate your ongoing commitment to your health goals. Please review the following plan we discussed and let me know if I can assist you in the future.   Screening recommendations/referrals: Colonoscopy: Up to date, due 07/2024 Mammogram: Up to date, due 11/2020 Bone Density: Up to date, due 11/2020 Recommended yearly ophthalmology/optometry visit for glaucoma screening and checkup Recommended yearly dental visit for hygiene and checkup  Vaccinations: Influenza vaccine: Up to date Pneumococcal vaccine: Completed series Tdap vaccine: Up to date Shingles vaccine: Pt declines today.     Advanced directives: Currently on file  Conditions/risks identified: Recommend to drink at least 6-8 8oz glasses of water per day.  Next appointment: Declined scheduling a follow up with PCP or an AWV for 2021 at this time.    Preventive Care 24 Years and Older, Female Preventive care refers to lifestyle choices and visits with your health care provider that can promote health and wellness. What does preventive care include?  A yearly physical exam. This is also called an annual well check.  Dental exams once or twice a year.  Routine eye exams. Ask your health care provider how often you should have your eyes checked.  Personal lifestyle choices, including:  Daily care of your teeth and gums.  Regular physical activity.  Eating a healthy diet.  Avoiding tobacco and drug use.  Limiting alcohol use.  Practicing safe sex.  Taking low-dose aspirin every day.  Taking vitamin and mineral supplements as recommended by your health care provider. What happens during an annual well check? The services and screenings done by your health care provider during your annual well check will depend on your age, overall health, lifestyle risk factors, and family history of disease. Counseling  Your health care provider  may ask you questions about your:  Alcohol use.  Tobacco use.  Drug use.  Emotional well-being.  Home and relationship well-being.  Sexual activity.  Eating habits.  History of falls.  Memory and ability to understand (cognition).  Work and work Statistician.  Reproductive health. Screening  You may have the following tests or measurements:  Height, weight, and BMI.  Blood pressure.  Lipid and cholesterol levels. These may be checked every 5 years, or more frequently if you are over 79 years old.  Skin check.  Lung cancer screening. You may have this screening every year starting at age 69 if you have a 30-pack-year history of smoking and currently smoke or have quit within the past 15 years.  Fecal occult blood test (FOBT) of the stool. You may have this test every year starting at age 60.  Flexible sigmoidoscopy or colonoscopy. You may have a sigmoidoscopy every 5 years or a colonoscopy every 10 years starting at age 12.  Hepatitis C blood test.  Hepatitis B blood test.  Sexually transmitted disease (STD) testing.  Diabetes screening. This is done by checking your blood sugar (glucose) after you have not eaten for a while (fasting). You may have this done every 1-3 years.  Bone density scan. This is done to screen for osteoporosis. You may have this done starting at age 63.  Mammogram. This may be done every 1-2 years. Talk to your health care provider about how often you should have regular mammograms. Talk with your health care provider about your test results, treatment options, and if necessary, the need for more tests. Vaccines  Your health care provider may  recommend certain vaccines, such as:  Influenza vaccine. This is recommended every year.  Tetanus, diphtheria, and acellular pertussis (Tdap, Td) vaccine. You may need a Td booster every 10 years.  Zoster vaccine. You may need this after age 10.  Pneumococcal 13-valent conjugate (PCV13) vaccine.  One dose is recommended after age 61.  Pneumococcal polysaccharide (PPSV23) vaccine. One dose is recommended after age 72. Talk to your health care provider about which screenings and vaccines you need and how often you need them. This information is not intended to replace advice given to you by your health care provider. Make sure you discuss any questions you have with your health care provider. Document Released: 04/24/2015 Document Revised: 12/16/2015 Document Reviewed: 01/27/2015 Elsevier Interactive Patient Education  2017 [place] Prevention in the Home Falls can cause injuries. They can happen to people of all ages. There are many things you can do to make your home safe and to help prevent falls. What can I do on the outside of my home?  Regularly fix the edges of walkways and driveways and fix any cracks.  Remove anything that might make you trip as you walk through a door, such as a raised step or threshold.  Trim any bushes or trees on the path to your home.  Use bright outdoor lighting.  Clear any walking paths of anything that might make someone trip, such as rocks or tools.  Regularly check to see if handrails are loose or broken. Make sure that both sides of any steps have handrails.  Any raised decks and porches should have guardrails on the edges.  Have any leaves, snow, or ice cleared regularly.  Use sand or salt on walking paths during winter.  Clean up any spills in your garage right away. This includes oil or grease spills. What can I do in the bathroom?  Use night lights.  Install grab bars by the toilet and in the tub and shower. Do not use towel bars as grab bars.  Use non-skid mats or decals in the tub or shower.  If you need to sit down in the shower, use a plastic, non-slip stool.  Keep the floor dry. Clean up any water that spills on the floor as soon as it happens.  Remove soap buildup in the tub or shower regularly.  Attach bath  mats securely with double-sided non-slip rug tape.  Do not have throw rugs and other things on the floor that can make you trip. What can I do in the bedroom?  Use night lights.  Make sure that you have a light by your bed that is easy to reach.  Do not use any sheets or blankets that are too big for your bed. They should not hang down onto the floor.  Have a firm chair that has side arms. You can use this for support while you get dressed.  Do not have throw rugs and other things on the floor that can make you trip. What can I do in the kitchen?  Clean up any spills right away.  Avoid walking on wet floors.  Keep items that you use a lot in easy-to-reach places.  If you need to reach something above you, use a strong step stool that has a grab bar.  Keep electrical cords out of the way.  Do not use floor polish or wax that makes floors slippery. If you must use wax, use non-skid floor wax.  Do not have throw rugs and  other things on the floor that can make you trip. What can I do with my stairs?  Do not leave any items on the stairs.  Make sure that there are handrails on both sides of the stairs and use them. Fix handrails that are broken or loose. Make sure that handrails are as long as the stairways.  Check any carpeting to make sure that it is firmly attached to the stairs. Fix any carpet that is loose or worn.  Avoid having throw rugs at the top or bottom of the stairs. If you do have throw rugs, attach them to the floor with carpet tape.  Make sure that you have a light switch at the top of the stairs and the bottom of the stairs. If you do not have them, ask someone to add them for you. What else can I do to help prevent falls?  Wear shoes that:  Do not have high heels.  Have rubber bottoms.  Are comfortable and fit you well.  Are closed at the toe. Do not wear sandals.  If you use a stepladder:  Make sure that it is fully opened. Do not climb a closed  stepladder.  Make sure that both sides of the stepladder are locked into place.  Ask someone to hold it for you, if possible.  Clearly mark and make sure that you can see:  Any grab bars or handrails.  First and last steps.  Where the edge of each step is.  Use tools that help you move around (mobility aids) if they are needed. These include:  Canes.  Walkers.  Scooters.  Crutches.  Turn on the lights when you go into a dark area. Replace any light bulbs as soon as they burn out.  Set up your furniture so you have a clear path. Avoid moving your furniture around.  If any of your floors are uneven, fix them.  If there are any pets around you, be aware of where they are.  Review your medicines with your doctor. Some medicines can make you feel dizzy. This can increase your chance of falling. Ask your doctor what other things that you can do to help prevent falls. This information is not intended to replace advice given to you by your health care provider. Make sure you discuss any questions you have with your health care provider. Document Released: 01/22/2009 Document Revised: 09/03/2015 Document Reviewed: 05/02/2014 Elsevier Interactive Patient Education  2017 [employer].

## 2019-03-28 ENCOUNTER — Other Ambulatory Visit: Payer: Self-pay | Admitting: Family Medicine

## 2019-03-28 DIAGNOSIS — Z9189 Other specified personal risk factors, not elsewhere classified: Secondary | ICD-10-CM

## 2019-03-28 DIAGNOSIS — M8580 Other specified disorders of bone density and structure, unspecified site: Secondary | ICD-10-CM

## 2019-05-02 ENCOUNTER — Other Ambulatory Visit: Payer: Self-pay | Admitting: Physician Assistant

## 2019-05-02 DIAGNOSIS — E7800 Pure hypercholesterolemia, unspecified: Secondary | ICD-10-CM

## 2019-05-25 ENCOUNTER — Ambulatory Visit: Payer: PPO | Attending: Internal Medicine

## 2019-05-25 DIAGNOSIS — Z23 Encounter for immunization: Secondary | ICD-10-CM | POA: Insufficient documentation

## 2019-05-25 NOTE — Progress Notes (Signed)
   Covid-19 Vaccination Clinic  Name:  Krystalee Shibuya    MRN: [id number] DOB: [birth year]  05/25/2019  Ms. Lewkowicz was observed post Covid-19 immunization for 15 minutes without incidence. She was provided with Vaccine Information Sheet and instruction to access the V-Safe system.   Ms. Morcom was instructed to call 911 with any severe reactions post vaccine: Marland Kitchen Difficulty breathing  . Swelling of your face and throat  . A fast heartbeat  . A bad rash all over your body  . Dizziness and weakness    Immunizations Administered    Name Date Dose VIS Date Route   Pfizer COVID-19 Vaccine 05/25/2019 10:58 AM 0.3 mL 03/22/2019 Intramuscular   Manufacturer: [place]   Lot: [id number]   [place]: [id number]

## 2019-06-15 ENCOUNTER — Ambulatory Visit: Payer: PPO | Attending: Internal Medicine

## 2019-06-15 ENCOUNTER — Other Ambulatory Visit: Payer: Self-pay

## 2019-06-15 DIAGNOSIS — Z23 Encounter for immunization: Secondary | ICD-10-CM | POA: Insufficient documentation

## 2019-06-15 NOTE — Progress Notes (Signed)
   Covid-19 Vaccination Clinic  Name:  Amanda Walton    MRN: [id number] DOB: 08/06/1945  06/15/2019  [name] was observed post Covid-19 immunization for 15 minutes without incident. She was provided with Vaccine Information Sheet and instruction to access the V-Safe system.   [name] was instructed to call 911 with any severe reactions post vaccine: Marland Kitchen Difficulty breathing  . Swelling of face and throat  . A fast heartbeat  . A bad rash all over body  . Dizziness and weakness   Immunizations Administered    Name Date Dose VIS Date Route   Pfizer COVID-19 Vaccine 06/15/2019 10:43 AM 0.3 mL 03/22/2019 Intramuscular   Manufacturer: [place]   Lot: [id number]   [place]: [id number]

## 2019-07-08 ENCOUNTER — Other Ambulatory Visit: Payer: Self-pay | Admitting: Family Medicine

## 2019-07-08 DIAGNOSIS — Z9189 Other specified personal risk factors, not elsewhere classified: Secondary | ICD-10-CM

## 2019-07-08 DIAGNOSIS — M8580 Other specified disorders of bone density and structure, unspecified site: Secondary | ICD-10-CM

## 2019-07-08 NOTE — Telephone Encounter (Signed)
Requested Prescriptions  Pending Prescriptions Disp Refills  . alendronate (FOSAMAX) 70 MG tablet [Pharmacy Med Name: ALENDRONATE SODIUM 70 MG TAB] 12 tablet 0    Sig: TAKE ONE TABLET BY MOUTH EACH WEEK, ON AN EMPTY STOMACH BEFORE BREAKFAST WITH 8oz OF WATER AND REMAIN UPRIGHT FOR :72     Endocrinology:  Bisphosphonates Failed - 07/08/2019  9:48 AM      Failed - Vitamin D in normal range and within 360 days    No results found for: [id number], [id number], [id number], 25OHVITD3, 25OHVITD2, 25OHVITD3, 25OHVITD2, 25OHVITD1, 25OHVITD2, 25OHVITD3, VD25OH       Passed - Ca in normal range and within 360 days    Calcium  Date Value Ref Range Status  10/10/2018 9.6 8.7 - 10.3 mg/dL Final         Passed - Valid encounter within last 12 months    Recent Outpatient Visits          9 months ago Annual physical exam   [facility] [place] Post, [place]   2 years ago Encounter for annual physical exam   [facility] [place], Dionne Bucy, [place]   3 years ago Annual physical exam   [facility] [place] Post, [place]   3 years ago [place] Carles Collet M, [place]   4 years ago Medicare annual wellness visit, subsequent   [facility] Margarita Rana, [place]

## 2019-07-10 DIAGNOSIS — H40153 Residual stage of open-angle glaucoma, bilateral: Secondary | ICD-10-CM | POA: Diagnosis not present

## 2019-09-25 DIAGNOSIS — D2272 Melanocytic nevi of left lower limb, including hip: Secondary | ICD-10-CM | POA: Diagnosis not present

## 2019-09-25 DIAGNOSIS — L821 Other seborrheic keratosis: Secondary | ICD-10-CM | POA: Diagnosis not present

## 2019-09-25 DIAGNOSIS — D2261 Melanocytic nevi of right upper limb, including shoulder: Secondary | ICD-10-CM | POA: Diagnosis not present

## 2019-09-25 DIAGNOSIS — D2262 Melanocytic nevi of left upper limb, including shoulder: Secondary | ICD-10-CM | POA: Diagnosis not present

## 2019-09-25 DIAGNOSIS — D2271 Melanocytic nevi of right lower limb, including hip: Secondary | ICD-10-CM | POA: Diagnosis not present

## 2019-09-25 DIAGNOSIS — D225 Melanocytic nevi of trunk: Secondary | ICD-10-CM | POA: Diagnosis not present

## 2019-10-14 ENCOUNTER — Other Ambulatory Visit: Payer: Self-pay | Admitting: Family Medicine

## 2019-10-14 DIAGNOSIS — Z9189 Other specified personal risk factors, not elsewhere classified: Secondary | ICD-10-CM

## 2019-10-14 DIAGNOSIS — M8580 Other specified disorders of bone density and structure, unspecified site: Secondary | ICD-10-CM

## 2019-10-15 ENCOUNTER — Other Ambulatory Visit: Payer: Self-pay | Admitting: Family Medicine

## 2019-10-15 DIAGNOSIS — Z9189 Other specified personal risk factors, not elsewhere classified: Secondary | ICD-10-CM

## 2019-10-15 DIAGNOSIS — M8580 Other specified disorders of bone density and structure, unspecified site: Secondary | ICD-10-CM

## 2019-10-25 ENCOUNTER — Telehealth: Payer: Self-pay

## 2019-10-25 NOTE — Telephone Encounter (Signed)
Is it okay to order a mammogram.   Thanks,   -Mickel Baas

## 2019-10-25 NOTE — Telephone Encounter (Signed)
Copied from [place] [id number]. Topic: Referral - Request for Referral >> Oct 25, 2019  9:02 AM Hinda Lenis D wrote: PT needs a referral for a Mammogram / please advise

## 2019-10-25 NOTE — Telephone Encounter (Signed)
Advised patient as below. She reports that she is having tenderness in her left breast and wanted to get it done sooner. Appt scheduled for evaluation.

## 2019-10-25 NOTE — Telephone Encounter (Signed)
It'll be ordered at her CPE

## 2019-11-01 ENCOUNTER — Encounter: Payer: PPO | Admitting: Family Medicine

## 2019-11-04 ENCOUNTER — Other Ambulatory Visit: Payer: Self-pay | Admitting: Family Medicine

## 2019-11-04 DIAGNOSIS — E7800 Pure hypercholesterolemia, unspecified: Secondary | ICD-10-CM

## 2019-11-04 NOTE — Telephone Encounter (Signed)
[facility] Requested Prescriptions  Pending Prescriptions Disp Refills  . simvastatin (ZOCOR) 20 MG tablet [Pharmacy Med Name: SIMVASTATIN 20 MG TAB] 90 tablet 0    Sig: TAKE 1 TABLET BY MOUTH AT BEDTIME     Cardiovascular:  Antilipid - Statins Failed - 11/04/2019  8:59 AM      Failed - Total Cholesterol in normal range and within 360 days    Cholesterol, Total  Date Value Ref Range Status  10/10/2018 213 (H) 100 - 199 mg/dL Final         Failed - LDL in normal range and within 360 days    LDL Calculated  Date Value Ref Range Status  10/10/2018 126 (H) 0 - 99 mg/dL Final         Failed - HDL in normal range and within 360 days    HDL  Date Value Ref Range Status  10/10/2018 65 >39 mg/dL Final         Failed - Triglycerides in normal range and within 360 days    Triglycerides  Date Value Ref Range Status  10/10/2018 109 0 - 149 mg/dL Final         Failed - Valid encounter within last 12 months    Recent Outpatient Visits          1 year ago Annual physical exam   [facility] [place] Post, PA-C   2 years ago Encounter for annual physical exam   [facility] [place], Dionne Bucy, [place]   3 years ago Annual physical exam   [facility] [place] Post, [place]   3 years ago [place] Carles Collet M, [place]   4 years ago Medicare annual wellness visit, subsequent   [place], [place]      Future Appointments            In 1 week [place], [place], [place], [place] - Patient is not pregnant

## 2019-11-06 DIAGNOSIS — H40153 Residual stage of open-angle glaucoma, bilateral: Secondary | ICD-10-CM | POA: Diagnosis not present

## 2019-11-07 ENCOUNTER — Ambulatory Visit: Payer: Self-pay | Admitting: Adult Health

## 2019-11-15 ENCOUNTER — Other Ambulatory Visit: Payer: Self-pay

## 2019-11-15 ENCOUNTER — Ambulatory Visit (INDEPENDENT_AMBULATORY_CARE_PROVIDER_SITE_OTHER): Payer: PPO | Admitting: Adult Health

## 2019-11-15 ENCOUNTER — Encounter: Payer: Self-pay | Admitting: Adult Health

## 2019-11-15 ENCOUNTER — Other Ambulatory Visit: Payer: Self-pay | Admitting: Adult Health

## 2019-11-15 ENCOUNTER — Telehealth: Payer: Self-pay

## 2019-11-15 VITALS — BP 121/69 | HR 68 | Temp 98.2°F | Resp 16 | Ht 63.0 in | Wt 136.0 lb

## 2019-11-15 DIAGNOSIS — M8580 Other specified disorders of bone density and structure, unspecified site: Secondary | ICD-10-CM | POA: Diagnosis not present

## 2019-11-15 DIAGNOSIS — Z1231 Encounter for screening mammogram for malignant neoplasm of breast: Secondary | ICD-10-CM

## 2019-11-15 DIAGNOSIS — Z1389 Encounter for screening for other disorder: Secondary | ICD-10-CM | POA: Diagnosis not present

## 2019-11-15 DIAGNOSIS — Z0000 Encounter for general adult medical examination without abnormal findings: Secondary | ICD-10-CM

## 2019-11-15 DIAGNOSIS — E7800 Pure hypercholesterolemia, unspecified: Secondary | ICD-10-CM | POA: Diagnosis not present

## 2019-11-15 LAB — POCT URINALYSIS DIPSTICK (MANUAL)
Leukocytes, UA: NEGATIVE
Nitrite, UA: NEGATIVE
Poct Bilirubin: NEGATIVE
Poct Blood: NEGATIVE
Poct Glucose: NORMAL mg/dL
Poct Ketones: NEGATIVE
Poct Protein: NEGATIVE mg/dL
Poct Urobilinogen: NORMAL mg/dL
Spec Grav, UA: 1.02 (ref 1.010–1.025)
pH, UA: 6 (ref 5.0–8.0)

## 2019-11-15 NOTE — Telephone Encounter (Signed)
Copied from [place] [phone]. Topic: General - Other >> Nov 15, 2019 11:00 AM Yvette Rack wrote: Reason for CRM: Pt stated she had an appt today and she was told that she needed to speak with the young lady at the front desk to discuss switching providers. Pt did not want to go in to detail about it she just requested that her call be returned.

## 2019-11-15 NOTE — Telephone Encounter (Signed)
Pt advised we will check with Dr. Jacinto Reap and Sharyn Lull [place] to make sue they are okay with the switch.

## 2019-11-15 NOTE — Patient Instructions (Addendum)
It was my pleasure to care for you today. Should you have any questions or concerns please do not hesitate to reach out to the office. You will receive lab results in [place] and be called by the office. Please give [place] 3 business days to get results to you. You may see them in your Mychart automatically, we will be resulting a note soon after as well.     Call to schedule your screening mammogram. Your orders have been placed for your exam.  Let our office know if you have questions, concerns, or any difficulty scheduling.  If normal results then yearly screening mammograms are recommended unless you notice  Changes in your breast then you should schedule a follow up office visit. If abnormal results  Further imaging will be warranted and sooner follow up as determined by the radiologist at the [facility].   [facility] at [place], [place] [address]  Main: [phone]    Health Maintenance for Postmenopausal Women Menopause is a normal process in which your ability to get pregnant comes to an end. This process happens slowly over many months or years, usually between the ages of 32 and 4. Menopause is complete when you have missed your menstrual periods for 12 months. It is important to talk with your health care provider about some of the most common conditions that affect women after menopause (postmenopausal women). These include heart disease, cancer, and bone loss (osteoporosis). Adopting a healthy lifestyle and getting preventive care can help to promote your health and wellness. The actions you take can also lower your chances of developing some of these common conditions. What should I know about menopause? During menopause, you may get a number of symptoms, such as:  Hot flashes. These can be moderate or severe.  Night sweats.  Decrease in sex drive.  Mood swings.  Headaches.  Tiredness.  Irritability.  Memory  problems.  Insomnia. Choosing to treat or not to treat these symptoms is a decision that you make with your health care provider. Do I need hormone replacement therapy?  Hormone replacement therapy is effective in treating symptoms that are caused by menopause, such as hot flashes and night sweats.  Hormone replacement carries certain risks, especially as you become older. If you are thinking about using estrogen or estrogen with progestin, discuss the benefits and risks with your health care provider. What is my risk for heart disease and stroke? The risk of heart disease, heart attack, and stroke increases as you age. One of the causes may be a change in the body's hormones during menopause. This can affect how your body uses dietary fats, triglycerides, and cholesterol. Heart attack and stroke are medical emergencies. There are many things that you can do to help prevent heart disease and stroke. Watch your blood pressure  High blood pressure causes heart disease and increases the risk of stroke. This is more likely to develop in people who have high blood pressure readings, are of African descent, or are overweight.  Have your blood pressure checked: ? Every 3-5 years if you are 86-98 years of age. ? Every year if you are 42 years old or older. Eat a healthy diet   Eat a diet that includes plenty of vegetables, fruits, low-fat dairy products, and lean protein.  Do not eat a lot of foods that are high in solid fats, added sugars, or sodium. Get regular exercise Get regular exercise. This is one of  the most important things you can do for your health. Most adults should:  Try to exercise for at least 150 minutes each week. The exercise should increase your heart rate and make you sweat (moderate-intensity exercise).  Try to do strengthening exercises at least twice each week. Do these in addition to the moderate-intensity exercise.  Spend less time sitting. Even light physical  activity can be beneficial. Other tips  Work with your health care provider to achieve or maintain a healthy weight.  Do not use any products that contain nicotine or tobacco, such as cigarettes, e-cigarettes, and chewing tobacco. If you need help quitting, ask your health care provider.  Know your numbers. Ask your health care provider to check your cholesterol and your blood sugar (glucose). Continue to have your blood tested as directed by your health care provider. Do I need screening for cancer? Depending on your health history and family history, you may need to have cancer screening at different stages of your life. This may include screening for:  Breast cancer.  Cervical cancer.  Lung cancer.  Colorectal cancer. What is my risk for osteoporosis? After menopause, you may be at increased risk for osteoporosis. Osteoporosis is a condition in which bone destruction happens more quickly than new bone creation. To help prevent osteoporosis or the bone fractures that can happen because of osteoporosis, you may take the following actions:  If you are 53-77 years old, get at least 1,000 mg of calcium and at least 600 mg of vitamin D per day.  If you are older than age 36 but younger than age 2, get at least 1,200 mg of calcium and at least 600 mg of vitamin D per day.  If you are older than age 57, get at least 1,200 mg of calcium and at least 800 mg of vitamin D per day. Smoking and drinking excessive alcohol increase the risk of osteoporosis. Eat foods that are rich in calcium and vitamin D, and do weight-bearing exercises several times each week as directed by your health care provider. How does menopause affect my mental health? Depression may occur at any age, but it is more common as you become older. Common symptoms of depression include:  Low or sad mood.  Changes in sleep patterns.  Changes in appetite or eating patterns.  Feeling an overall lack of motivation or  enjoyment of activities that you previously enjoyed.  Frequent crying spells. Talk with your health care provider if you think that you are experiencing depression. General instructions See your health care provider for regular wellness exams and vaccines. This may include:  Scheduling regular health, dental, and eye exams.  Getting and maintaining your vaccines. These include: ? Influenza vaccine. Get this vaccine each year before the flu season begins. ? Pneumonia vaccine. ? Shingles vaccine. ? Tetanus, diphtheria, and pertussis (Tdap) booster vaccine. Your health care provider may also recommend other immunizations. Tell your health care provider if you have ever been abused or do not feel safe at home. Summary  Menopause is a normal process in which your ability to get pregnant comes to an end.  This condition causes hot flashes, night sweats, decreased interest in sex, mood swings, headaches, or lack of sleep.  Treatment for this condition may include hormone replacement therapy.  Take actions to keep yourself healthy, including exercising regularly, eating a healthy diet, watching your weight, and checking your blood pressure and blood sugar levels.  Get screened for cancer and depression. Make sure that  you are up to date with all your vaccines. This information is not intended to replace advice given to you by your health care provider. Make sure you discuss any questions you have with your health care provider. Document Revised: 03/21/2018 Document Reviewed: 03/21/2018 Elsevier Patient Education  2020 [place] Maintenance, Female Adopting a healthy lifestyle and getting preventive care are important in promoting health and wellness. Ask your health care provider about:  The right schedule for you to have regular tests and exams.  Things you can do on your own to prevent diseases and keep yourself healthy. What should I know about diet, weight, and  exercise? Eat a healthy diet   Eat a diet that includes plenty of vegetables, fruits, low-fat dairy products, and lean protein.  Do not eat a lot of foods that are high in solid fats, added sugars, or sodium. Maintain a healthy weight Body mass index (BMI) is used to identify weight problems. It estimates body fat based on height and weight. Your health care provider can help determine your BMI and help you achieve or maintain a healthy weight. Get regular exercise Get regular exercise. This is one of the most important things you can do for your health. Most adults should:  Exercise for at least 150 minutes each week. The exercise should increase your heart rate and make you sweat (moderate-intensity exercise).  Do strengthening exercises at least twice a week. This is in addition to the moderate-intensity exercise.  Spend less time sitting. Even light physical activity can be beneficial. Watch cholesterol and blood lipids Have your blood tested for lipids and cholesterol at 74 years of age, then have this test every 5 years. Have your cholesterol levels checked more often if:  Your lipid or cholesterol levels are high.  You are older than 74 years of age.  You are at high risk for heart disease. What should I know about cancer screening? Depending on your health history and family history, you may need to have cancer screening at various ages. This may include screening for:  Breast cancer.  Cervical cancer.  Colorectal cancer.  Skin cancer.  Lung cancer. What should I know about heart disease, diabetes, and high blood pressure? Blood pressure and heart disease  High blood pressure causes heart disease and increases the risk of stroke. This is more likely to develop in people who have high blood pressure readings, are of African descent, or are overweight.  Have your blood pressure checked: ? Every 3-5 years if you are 63-50 years of age. ? Every year if you are 40  years old or older. Diabetes Have regular diabetes screenings. This checks your fasting blood sugar level. Have the screening done:  Once every three years after age 67 if you are at a normal weight and have a low risk for diabetes.  More often and at a younger age if you are overweight or have a high risk for diabetes. What should I know about preventing infection? Hepatitis B If you have a higher risk for hepatitis B, you should be screened for this virus. Talk with your health care provider to find out if you are at risk for hepatitis B infection. Hepatitis C Testing is recommended for:  Everyone born from [birth year] through 1965.  Anyone with known risk factors for hepatitis C. Sexually transmitted infections (STIs)  Get screened for STIs, including gonorrhea and chlamydia, if: ? You are sexually active and are younger than 74 years of  age. ? You are older than 74 years of age and your health care provider tells you that you are at risk for this type of infection. ? Your sexual activity has changed since you were last screened, and you are at increased risk for chlamydia or gonorrhea. Ask your health care provider if you are at risk.  Ask your health care provider about whether you are at high risk for HIV. Your health care provider may recommend a prescription medicine to help prevent HIV infection. If you choose to take medicine to prevent HIV, you should first get tested for HIV. You should then be tested every 3 months for as long as you are taking the medicine. Pregnancy  If you are about to stop having your period (premenopausal) and you may become pregnant, seek counseling before you get pregnant.  Take 400 to 800 micrograms (mcg) of folic acid every day if you become pregnant.  Ask for birth control (contraception) if you want to prevent pregnancy. Osteoporosis and menopause Osteoporosis is a disease in which the bones lose minerals and strength with aging. This can result in  bone fractures. If you are 98 years old or older, or if you are at risk for osteoporosis and fractures, ask your health care provider if you should:  Be screened for bone loss.  Take a calcium or vitamin D supplement to lower your risk of fractures.  Be given hormone replacement therapy (HRT) to treat symptoms of menopause. Follow these instructions at home: Lifestyle  Do not use any products that contain nicotine or tobacco, such as cigarettes, e-cigarettes, and chewing tobacco. If you need help quitting, ask your health care provider.  Do not use street drugs.  Do not share needles.  Ask your health care provider for help if you need support or information about quitting drugs. Alcohol use  Do not drink alcohol if: ? Your health care provider tells you not to drink. ? You are pregnant, may be pregnant, or are planning to become pregnant.  If you drink alcohol: ? Limit how much you use to 0-1 drink a day. ? Limit intake if you are breastfeeding.  Be aware of how much alcohol is in your drink. In the [place] one drink equals one 12 oz bottle of beer (355 mL), one 5 oz glass of wine (148 mL), or one 1 oz glass of hard liquor (44 mL). General instructions  Schedule regular health, dental, and eye exams.  Stay current with your vaccines.  Tell your health care provider if: ? You often feel depressed. ? You have ever been abused or do not feel safe at home. Summary  Adopting a healthy lifestyle and getting preventive care are important in promoting health and wellness.  Follow your health care provider's instructions about healthy diet, exercising, and getting tested or screened for diseases.  Follow your health care provider's instructions on monitoring your cholesterol and blood pressure. This information is not intended to replace advice given to you by your health care provider. Make sure you discuss any questions you have with your health care provider. Document Revised:  03/21/2018 Document Reviewed: 03/21/2018 Elsevier Patient Education  2020 [employer].

## 2019-11-15 NOTE — Progress Notes (Signed)
Urine within normal limits.

## 2019-11-15 NOTE — Progress Notes (Signed)
I,Amanda Walton,acting as a scribe for [employer], FNP.,have documented all relevant documentation on the behalf of Amanda Buffy, FNP,as directed by  Amanda Buffy, FNP while in the presence of Amanda Walton, [place].   Complete physical exam   Patient: Amanda Walton   DOB: 08-15-1945   74 y.o. Female  MRN: [id number] Visit Date: 11/15/2019  Today's healthcare provider: [name], FNP   Chief Complaint  Patient presents with  . Annual Exam   Subjective    Amanda Walton is a 74 y.o. female who presents today for a complete physical exam.  She reports consuming a general diet. Home exercise routine includes exercise bike and walking. She generally feels well. She reports sleeping fairly well. She does not have additional problems to discuss today.  HPI  Patient is having breast tenderness, left outer breast around the end of June, that since resolved. Denies any lumps. . Denies nipple discharge or skin dimpling. Tenderness has all resolved. She is due for screening mammogram. Denies any breast cancer history personal or family. She denies any symptoms or concerns today.   She does still have uterus/ ovaries, no PAP since 2017 - negative for lesion or malignancy. Denies any history of abnormal vaginal bleeding or spotting. Denies pelvic pain. She reports she has had at least 3 PAP negative prior to 2017.   Shingrix vaccine she did have  08/14/2019 Patient had AWV with [facility] on 03/31/2019.  She denies any concerns at this visit.  Due for Dexa scan 11/2020.   Patient  denies any fever, body aches,chills, rash, chest pain, shortness of breath, nausea, vomiting, or diarrhea.  Denies dizziness, lightheadedness, pre syncopal or syncopal episodes.    Past Medical History:  Diagnosis Date  . Arthritis    Past Surgical History:  Procedure Laterality Date  . TONSILLECTOMY AND ADENOIDECTOMY    . TOTAL HIP ARTHROPLASTY Right 08/04/2015    Procedure: RIGHT TOTAL HIP ARTHROPLASTY ANTERIOR APPROACH;  Surgeon: Paralee Cancel, MD;  Location: [facility];  Service: Orthopedics;  Laterality: Right;  . TUBAL LIGATION     Social History   Socioeconomic History  . Marital status: Married    Spouse name: Not on file  . Number of children: 2  . Years of education: Not on file  . Highest education level: Associate degree: occupational, Hotel manager, or vocational program  Occupational History  . Occupation: retired    Comment: does some part time work at [employer]  . Smoking status: Former Smoker    Quit date: 04/10/1989    Years since quitting: 30.6  . Smokeless tobacco: Never Used  Vaping Use  . Vaping Use: Never used  Substance and Sexual Activity  . Alcohol use: Yes    Alcohol/week: 9.0 standard drinks    Types: 9 Glasses of wine per week    Comment: 1 glass M-F, a couple on the weekend  . Drug use: No  . Sexual activity: Not on file  Other Topics Concern  . Not on file  Social History Narrative  . Not on file   Social Determinants of Health   Financial Resource Strain:   . Difficulty of Paying Living Expenses:   Food Insecurity:   . Worried About Charity fundraiser in the Last Year:   . Arboriculturist in the Last Year:   Transportation Needs:   . Film/video editor (Medical):   Marland Kitchen Lack of Transportation (Non-Medical):   Physical  Activity: Insufficiently Active  . Days of Exercise per Week: 7 days  . Minutes of Exercise per Session: 20 min  Stress: No Stress Concern Present  . Feeling of Stress : Only a little  Social Connections: Unknown  . Frequency of Communication with Friends and Family: Patient refused  . Frequency of Social Gatherings with Friends and Family: Patient refused  . Attends Religious Services: Patient refused  . Active Member of Clubs or Organizations: Patient refused  . Attends Archivist Meetings: Patient refused  . Marital Status: Patient refused  Intimate Partner  Violence: Unknown  . Fear of Current or Ex-Partner: Patient refused  . Emotionally Abused: Patient refused  . Physically Abused: Patient refused  . Sexually Abused: Patient refused   Family Status  Relation Name Status  . Mother  Deceased at age 73  . Father  Alive  . Sister  Alive  . Sister  Deceased at age 93  . Neg Hx  (Not Specified)   Family History  Problem Relation Age of Onset  . Dementia Mother   . Hypertension Father   . Heart disease Father   . Hyperlipidemia Sister   . Pancreatic cancer Sister   . Breast cancer Neg Hx    No Known Allergies  Patient Care Team: Virginia Crews, MD as PCP - General (Family Medicine) Rubie Maid, MD as Referring Physician (Obstetrics and Gynecology) Dasher, Rayvon Char, MD as Consulting Physician (Dermatology) Lorelee Cover., MD as Consulting Physician (Ophthalmology)   Medications: Outpatient Medications Prior to Visit  Medication Sig  . alendronate (FOSAMAX) 70 MG tablet TAKE ONE TABLET BY MOUTH EACH WEEK, ON AN EMPTY STOMACH BEFORE BREAKFAST WITH 8oz OF WATER AND REMAIN UPRIGHT FOR :30  . aspirin (ASPIRIN LOW DOSE) 81 MG EC tablet Take 81 mg by mouth daily. occasionally  . CALCIUM CARBONATE-VITAMIN D PO Take 2 tablets by mouth daily.   Marland Kitchen FIBER COMPLETE PO Take 2 capsules by mouth daily.   . simvastatin (ZOCOR) 20 MG tablet TAKE 1 TABLET BY MOUTH AT BEDTIME  . latanoprost (XALATAN) 0.005 % ophthalmic solution   . SHINGRIX injection   . [DISCONTINUED] conjugated estrogens (PREMARIN) vaginal cream Place 1 Applicatorful 2 (two) times a week vaginally. (Patient not taking: Reported on 07/04/2017)   No facility-administered medications prior to visit.    Review of Systems  All other systems reviewed and are negative.     Objective    BP 121/69 (BP Location: Left Arm, Patient Position: Sitting, Cuff Size: Normal)   Pulse 68   Temp 98.2 F (36.8 C) (Oral)   Resp 16   Ht 5\' 3"  (1.6 m)   Wt 136 lb (61.7 kg)   SpO2 97%    BMI 24.09 kg/m  BP Readings from Last 3 Encounters:  11/15/19 121/69  10/10/18 128/74  07/04/17 126/66   Wt Readings from Last 3 Encounters:  11/15/19 136 lb (61.7 kg)  10/10/18 133 lb (60.3 kg)  07/04/17 138 lb (62.6 kg)      Physical Exam Vitals reviewed.  Constitutional:      General: She is not in acute distress.    Appearance: She is well-developed and normal weight. She is not ill-appearing, toxic-appearing or diaphoretic.     Interventions: She is not intubated.    Comments: Patient is alert and oriented and responsive to questions Engages in eye contact with provider. Speaks in full sentences without any pauses without any shortness of breath or distress.    HENT:  Head: Normocephalic and atraumatic.     Right Ear: Tympanic membrane, ear canal and external ear normal. There is no impacted cerumen.     Left Ear: Tympanic membrane, ear canal and external ear normal. There is impacted cerumen.     Nose: Nose normal. No congestion or rhinorrhea.     Mouth/Throat:     Mouth: Mucous membranes are moist.     Pharynx: No oropharyngeal exudate or posterior oropharyngeal erythema.  Eyes:     General: Lids are normal. No scleral icterus.       Right eye: No discharge.        Left eye: No discharge.     Conjunctiva/sclera: Conjunctivae normal.     Right eye: Right conjunctiva is not injected. No exudate or hemorrhage.    Left eye: Left conjunctiva is not injected. No exudate or hemorrhage.    Pupils: Pupils are equal, round, and reactive to light.  Neck:     Thyroid: No thyroid mass or thyromegaly.     Vascular: Normal carotid pulses. No carotid bruit, hepatojugular reflux or JVD.     Trachea: Trachea and phonation normal. No tracheal tenderness or tracheal deviation.     Meningeal: Brudzinski's sign and Kernig's sign absent.  Cardiovascular:     Rate and Rhythm: Normal rate and regular rhythm.     Pulses: Normal pulses.          Radial pulses are 2+ on the right side  and 2+ on the left side.       Dorsalis pedis pulses are 2+ on the right side and 2+ on the left side.       Posterior tibial pulses are 2+ on the right side and 2+ on the left side.     Heart sounds: Normal heart sounds, S1 normal and S2 normal. Heart sounds not distant. No murmur heard.  No friction rub. No gallop.   Pulmonary:     Effort: Pulmonary effort is normal. No tachypnea, bradypnea, accessory muscle usage or respiratory distress. She is not intubated.     Breath sounds: Normal breath sounds. No stridor. No wheezing or rales.  Chest:     Chest wall: No mass, tenderness or edema. There is no dullness to percussion.     Breasts: Tanner Score is 5.     Comments: Declined does self breast exams denies concerns going for screening mammogram.  Abdominal:     General: Bowel sounds are normal. There is no distension or abdominal bruit.     Palpations: Abdomen is soft. There is no shifting dullness, fluid wave, hepatomegaly, splenomegaly, mass or pulsatile mass.     Tenderness: There is no abdominal tenderness. There is no right CVA tenderness, left CVA tenderness, guarding or rebound.     Hernia: No hernia is present.  Genitourinary:    Comments: Declined  Musculoskeletal:        General: No tenderness or deformity. Normal range of motion.     Cervical back: Full passive range of motion without pain, normal range of motion and neck supple. No edema, erythema, rigidity or tenderness. No spinous process tenderness or muscular tenderness. Normal range of motion.  Lymphadenopathy:     Head:     Right side of head: No submental, submandibular, tonsillar, preauricular, posterior auricular or occipital adenopathy.     Left side of head: No submental, submandibular, tonsillar, preauricular, posterior auricular or occipital adenopathy.     Cervical: No cervical adenopathy.     Right cervical:  No superficial, deep or posterior cervical adenopathy.    Left cervical: No superficial, deep or  posterior cervical adenopathy.     Upper Body:     Right upper body: No supraclavicular or pectoral adenopathy.     Left upper body: No supraclavicular or pectoral adenopathy.  Skin:    General: Skin is warm and dry.     Coloration: Skin is not pale.     Findings: No abrasion, bruising, burn, ecchymosis, erythema, lesion, petechiae or rash.     Nails: There is no clubbing.  Neurological:     Mental Status: She is alert and oriented to person, place, and time.     GCS: GCS eye subscore is 4. GCS verbal subscore is 5. GCS motor subscore is 6.     Cranial Nerves: No cranial nerve deficit.     Sensory: No sensory deficit.     Motor: No tremor, atrophy, abnormal muscle tone or seizure activity.     Coordination: Coordination normal.     Gait: Gait normal.     Deep Tendon Reflexes: Reflexes are normal and symmetric. Reflexes normal. Babinski sign absent on the right side. Babinski sign absent on the left side.     Reflex Scores:      Tricep reflexes are 2+ on the right side and 2+ on the left side.      Bicep reflexes are 2+ on the right side and 2+ on the left side.      Brachioradialis reflexes are 2+ on the right side and 2+ on the left side.      Patellar reflexes are 2+ on the right side and 2+ on the left side.      Achilles reflexes are 2+ on the right side and 2+ on the left side. Psychiatric:        Mood and Affect: Mood normal.        Speech: Speech normal.        Behavior: Behavior normal.        Thought Content: Thought content normal.        Judgment: Judgment normal.       Last depression screening scores PHQ 2/9 Scores 11/15/2019 03/13/2019 10/10/2018  PHQ - 2 Score 0 1 0  PHQ- 9 Score 0 - 0   Last fall risk screening Fall Risk  11/15/2019  Falls in the past year? 0  Number falls in past yr: 0  Injury with Fall? 0  Follow up Falls evaluation completed   Last Audit-C alcohol use screening Alcohol Use Disorder Test (AUDIT) 11/15/2019  1. How often do you have a drink  containing alcohol? 4  2. How many drinks containing alcohol do you have on a typical day when you are drinking? 0  3. How often do you have six or more drinks on one occasion? 0  AUDIT-C Score 4  4. How often during the last year have you found that you were not able to stop drinking once you had started? 0  5. How often during the last year have you failed to do what was normally expected from you because of drinking? 0  6. How often during the last year have you needed a first drink in the morning to get yourself going after a heavy drinking session? 0  7. How often during the last year have you had a feeling of guilt of remorse after drinking? 0  8. How often during the last year have you been unable to remember  what happened the night before because you had been drinking? 0  9. Have you or someone else been injured as a result of your drinking? 0  10. Has a relative or friend or a doctor or another health worker been concerned about your drinking or suggested you cut down? 0  Alcohol Use Disorder Identification Test Final Score (AUDIT) 4  Alcohol Brief Interventions/Follow-up AUDIT Score <7 follow-up not indicated   A score of 3 or more in women, and 4 or more in men indicates increased risk for alcohol abuse, EXCEPT if all of the points are from question 1   Results for orders placed or performed in visit on 11/15/19  POCT Urinalysis Dip Manual  Result Value Ref Range   Spec Grav, UA 1.020 1.010 - 1.025   pH, UA 6.0 5.0 - 8.0   Leukocytes, UA Negative Negative   Nitrite, UA Negative Negative   Poct Protein Negative Negative, trace mg/dL   Poct Glucose Normal Normal mg/dL   Poct Ketones Negative Negative   Poct Urobilinogen Normal Normal mg/dL   Poct Bilirubin Negative Negative   Poct Blood Negative Negative, trace    Assessment & Plan    Routine Health Maintenance and Physical Exam  Exercise Activities and Dietary recommendations Goals    . DIET - INCREASE WATER INTAKE      Recommend increasing water intake to 4-6 glasses a day.        Immunization History  Administered Date(s) Administered  . Fluad Quad(high Dose 65+) 01/30/2019  . Influenza Split 01/16/2009, 01/22/2010, 03/21/2012  . Influenza, High Dose Seasonal PF 04/23/2014, 04/20/2017, 02/28/2018  . Influenza,inj,Quad PF,6+ Mos 03/27/2013  . PFIZER SARS-COV-2 Vaccination 05/25/2019, 06/15/2019  . Pneumococcal Conjugate-13 04/23/2014  . Pneumococcal Polysaccharide-23 03/18/2011  . Tdap 07/06/2005  . Zoster 01/16/2009    Health Maintenance  Topic Date Due  . INFLUENZA VACCINE  02/15/2020 (Originally 11/10/2019)  . TETANUS/TDAP  03/12/2020 (Originally 07/07/2015)  . MAMMOGRAM  11/20/2020  . DEXA SCAN  11/20/2020  . COLONOSCOPY  07/23/2024  . COVID-19 Vaccine  Completed  . Hepatitis C Screening  Completed  . PNA vac Low Risk Adult  Completed   Tetanus needs 03/12/2020 or after may return for.  Colonoscopy was with Dr. Allen Norris due again 10 years 07/23/24   1. Annual physical exam The patient is advised to begin progressive daily aerobic exercise program, follow a low fat, low cholesterol diet, reduce exposure to stress, improve dietary compliance, continue current medications, continue current healthy lifestyle patterns and return for routine annual checkups. - CBC with Differential/Platelet - Comprehensive Metabolic Panel (CMET)  2. Screening mammogram, encounter for  - MM Digital Screening  3. Hypercholesteremia Continue Zocor  - CBC with Differential/Platelet - Comprehensive Metabolic Panel (CMET) - Lipid Panel w/o Chol/HDL Ratio  4. Osteopenia, unspecified location Continue Fosamax  DEXA due 11/2020 every 2 years per last screening. Reviewed Dr. Brita Romp, Dionne Bucy, MD Note and patient denies any bone pain/ fractures or falls.   5. Screening for blood or protein in urine  - POCT Urinalysis Dip Manual Discussed health benefits of physical activity, and encouraged her to engage in  regular exercise appropriate for her age and condition.   Denies any refill needs today.   Red Flags discussed. The patient was given clear instructions to go to ER or return to medical center if any red flags develop, symptoms do not improve, worsen or new problems develop. They verbalized understanding.    Return in about 6 months (  around 05/17/2020), or if symptoms worsen or fail to improve, for at any time for any worsening symptoms, Go to Emergency room/ urgent care if worse.     IWellington [place] Aidenn Skellenger, FNP, have reviewed all documentation for this visit. The documentation on 11/15/19 for the exam, diagnosis, procedures, and orders are all accurate and complete.   Amanda Walton, [place] [phone] (phone) [phone] (fax)  [place]

## 2019-11-16 LAB — CBC WITH DIFFERENTIAL/PLATELET
Basophils Absolute: 0 10*3/uL (ref 0.0–0.2)
Basos: 1 %
EOS (ABSOLUTE): 0.2 10*3/uL (ref 0.0–0.4)
Eos: 3 %
Hematocrit: 42.8 % (ref 34.0–46.6)
Hemoglobin: 13.8 g/dL (ref 11.1–15.9)
Immature Grans (Abs): 0 10*3/uL (ref 0.0–0.1)
Immature Granulocytes: 0 %
Lymphocytes Absolute: 1.2 10*3/uL (ref 0.7–3.1)
Lymphs: 18 %
MCH: 31.1 pg (ref 26.6–33.0)
MCHC: 32.2 g/dL (ref 31.5–35.7)
MCV: 96 fL (ref 79–97)
Monocytes Absolute: 0.6 10*3/uL (ref 0.1–0.9)
Monocytes: 9 %
Neutrophils Absolute: 4.7 10*3/uL (ref 1.4–7.0)
Neutrophils: 69 %
Platelets: 285 10*3/uL (ref 150–450)
RBC: 4.44 x10E6/uL (ref 3.77–5.28)
RDW: 12.8 % (ref 11.7–15.4)
WBC: 6.8 10*3/uL (ref 3.4–10.8)

## 2019-11-16 LAB — LIPID PANEL W/O CHOL/HDL RATIO
Cholesterol, Total: 194 mg/dL (ref 100–199)
HDL: 61 mg/dL (ref >39)
LDL Chol Calc (NIH): 114 mg/dL — ABNORMAL HIGH (ref 0–99)
Triglycerides: 108 mg/dL (ref 0–149)
VLDL Cholesterol Cal: 19 mg/dL (ref 5–40)

## 2019-11-16 LAB — COMPREHENSIVE METABOLIC PANEL
ALT: 12 IU/L (ref 0–32)
AST: 20 IU/L (ref 0–40)
Albumin/Globulin Ratio: 1.4 (ref 1.2–2.2)
Albumin: 4.2 g/dL (ref 3.7–4.7)
Alkaline Phosphatase: 78 IU/L (ref 48–121)
BUN/Creatinine Ratio: 30 — ABNORMAL HIGH (ref 12–28)
BUN: 18 mg/dL (ref 8–27)
Bilirubin Total: 0.5 mg/dL (ref 0.0–1.2)
CO2: 25 mmol/L (ref 20–29)
Calcium: 9.6 mg/dL (ref 8.7–10.3)
Chloride: 102 mmol/L (ref 96–106)
Creatinine, Ser: 0.61 mg/dL (ref 0.57–1.00)
GFR calc Af Amer: 104 mL/min/{1.73_m2} (ref >59)
GFR calc non Af Amer: 90 mL/min/{1.73_m2} (ref >59)
Globulin, Total: 3 g/dL (ref 1.5–4.5)
Glucose: 86 mg/dL (ref 65–99)
Potassium: 4.8 mmol/L (ref 3.5–5.2)
Sodium: 140 mmol/L (ref 134–144)
Total Protein: 7.2 g/dL (ref 6.0–8.5)

## 2019-11-16 LAB — TSH: TSH: 3.88 u[IU]/mL (ref 0.450–4.500)

## 2019-11-21 ENCOUNTER — Encounter: Payer: Self-pay | Admitting: Adult Health

## 2019-11-21 ENCOUNTER — Telehealth: Payer: Self-pay

## 2019-11-21 DIAGNOSIS — E7800 Pure hypercholesterolemia, unspecified: Secondary | ICD-10-CM

## 2019-11-21 NOTE — Telephone Encounter (Signed)
Please advise 

## 2019-11-21 NOTE — Telephone Encounter (Signed)
Patient results have been reviewed will add in future lab for 05/2020

## 2019-11-21 NOTE — Telephone Encounter (Signed)
-----   Message from Doreen Beam, [place] sent at 11/21/2019 11:02 AM EDT ----- CBC within normal limits, no signs of infection or anemia. CMP within normal limits kidney and liver function as well as electrolytes. TSH for thyroid within normal limits. Cholesterol LDL mildly elevated at 114, continue dietary changes and increasing exercise.. Will continue with Zocor 20 mg daily for now and recheck cholesterol in 6 months advised after aggressive dietary and lifestyle changes to help prevent cardiovascular disease in the future.   Please add lipid panel  to labs in 6 months fasting around 05/2020.

## 2019-11-21 NOTE — Progress Notes (Signed)
CBC within normal limits, no signs of infection or anemia. CMP within normal limits kidney and liver function as well as electrolytes. TSH for thyroid within normal limits. Cholesterol LDL mildly elevated at 114, continue dietary changes and increasing exercise.. Will continue with Zocor 20 mg daily for now and recheck cholesterol in 6 months advised after aggressive dietary and lifestyle changes to help prevent cardiovascular disease in the future.   Please add lipid panel  to labs in 6 months fasting around 05/2020.

## 2019-11-22 NOTE — Telephone Encounter (Signed)
Im fine with a switch, but honestly confused about which way it is going (from me or to me).  Looks like she has seen Sharyn Lull recently and has f/u with her.

## 2019-11-25 NOTE — Telephone Encounter (Signed)
Patient advised as below. Patient does want to continue with Amanda Walton.

## 2019-12-05 ENCOUNTER — Other Ambulatory Visit: Payer: Self-pay

## 2019-12-05 ENCOUNTER — Ambulatory Visit
Admission: RE | Admit: 2019-12-05 | Discharge: 2019-12-05 | Disposition: A | Discharge status: Home / Self Care | Payer: PPO | Source: Ambulatory Visit | Attending: Adult Health | Admitting: Adult Health

## 2019-12-05 DIAGNOSIS — Z1231 Encounter for screening mammogram for malignant neoplasm of breast: Secondary | ICD-10-CM | POA: Diagnosis not present

## 2020-01-13 ENCOUNTER — Other Ambulatory Visit: Payer: Self-pay | Admitting: Family Medicine

## 2020-01-13 DIAGNOSIS — Z9189 Other specified personal risk factors, not elsewhere classified: Secondary | ICD-10-CM

## 2020-01-13 DIAGNOSIS — M8580 Other specified disorders of bone density and structure, unspecified site: Secondary | ICD-10-CM

## 2020-01-13 NOTE — Telephone Encounter (Signed)
Requested medication (s) are due for refill today: yes  Requested medication (s) are on the active medication list: yes  Last refill:  10/15/19  Future visit scheduled: yes  Notes to clinic:  overdue lab work    Requested Prescriptions  Pending Prescriptions Disp Refills   alendronate (FOSAMAX) 70 MG tablet [Pharmacy Med Name: ALENDRONATE SODIUM 70 MG TAB] 12 tablet 0    Sig: TAKE ONE TABLET BY MOUTH EACH WEEK, ON AN EMPTY STOMACH BEFORE BREAKFAST WITH 8oz OF WATER AND REMAIN UPRIGHT FOR :30      Endocrinology:  Bisphosphonates Failed - 01/13/2020  1:47 PM      Failed - Vitamin D in normal range and within 360 days    No results found for: [id number], [id number], [id number], 25OHVITD3, 25OHVITD2, 25OHVITD3, 25OHVITD2, 25OHVITD1, 25OHVITD2, 25OHVITD3, VD25OH        Passed - Ca in normal range and within 360 days    Calcium  Date Value Ref Range Status  11/15/2019 9.6 8.7 - 10.3 mg/dL Final          Passed - Valid encounter within last 12 months    Recent Outpatient Visits           1 month ago Annual physical exam   [facility], Kelby Aline, [place]   1 year ago Annual physical exam   [facility] [place], [place]   2 years ago Encounter for annual physical exam   [facility] [place], Dionne Bucy, [place]   3 years ago Annual physical exam   [facility] [place], [place]   3 years ago [place], Wendee Beavers, [place]       Future Appointments             In 4 months Flinchum, Kelby Aline, [place], [place]

## 2020-01-16 ENCOUNTER — Other Ambulatory Visit: Payer: Self-pay | Admitting: Adult Health

## 2020-01-16 DIAGNOSIS — Z9189 Other specified personal risk factors, not elsewhere classified: Secondary | ICD-10-CM

## 2020-01-16 DIAGNOSIS — M8580 Other specified disorders of bone density and structure, unspecified site: Secondary | ICD-10-CM

## 2020-01-16 MED ORDER — ALENDRONATE SODIUM 70 MG PO TABS: ORAL_TABLET | ORAL | 3 refills | Status: DC

## 2020-01-16 NOTE — Telephone Encounter (Signed)
Requested Prescriptions  Pending Prescriptions Disp Refills   alendronate (FOSAMAX) 70 MG tablet 12 tablet 3    Sig: Take with a full glass of water on an empty stomach.     Endocrinology:  Bisphosphonates Failed - 01/16/2020  9:28 AM      Failed - Vitamin D in normal range and within 360 days    No results found for: [id number], [id number], [id number], 25OHVITD3, 25OHVITD2, 25OHVITD3, 25OHVITD2, 25OHVITD1, 25OHVITD2, 25OHVITD3, VD25OH       Passed - Ca in normal range and within 360 days    Calcium  Date Value Ref Range Status  11/15/2019 9.6 8.7 - 10.3 mg/dL Final         Passed - Valid encounter within last 12 months    Recent Outpatient Visits          2 months ago Annual physical exam   [facility] Fl[facility], Kelby Aline, FNP   1 year ago Annual physical exam   [facility] [place], [place]   2 years ago Encounter for annual physical exam   [facility] [place], Dionne Bucy, [place]   3 years ago Annual physical exam   [facility] [place], [place]   3 years ago [place], Wendee Beavers, [place]      Future Appointments            In 4 months Flinchum, Kelby Aline, [place], [place]

## 2020-01-16 NOTE — Telephone Encounter (Signed)
Copied from [place] [phone]. Topic: Quick Communication - Rx Refill/Question >> Jan 16, 2020  9:19 AM Leward Quan A wrote: Medication: alendronate (FOSAMAX) 70 MG tablet  Per patient she requested this on 01/12/20 please advise  Has the patient contacted their pharmacy? Yes.   (Agent: If no, request that the patient contact the pharmacy for the refill.) (Agent: If yes, when and what did the pharmacy advise?)  Preferred Pharmacy (with phone number or street name): [facility] [place], [place].  Phone:  [phone] Fax:  [phone]     Agent: Please be advised that RX refills may take up to 3 business days. We ask that you follow-up with your pharmacy.

## 2020-02-26 DIAGNOSIS — H40153 Residual stage of open-angle glaucoma, bilateral: Secondary | ICD-10-CM | POA: Diagnosis not present

## 2020-04-20 NOTE — Progress Notes (Signed)
Subjective:   Amanda Walton is a 75 y.o. female who presents for Medicare Annual (Subsequent) preventive examination.  I connected with Amanda Walton today by telephone and verified that I am speaking with the correct person using two identifiers. Location patient: home Location provider: work Persons participating in the virtual visit: patient, provider.   I discussed the limitations, risks, security and privacy concerns of performing an evaluation and management service by telephone and the availability of in person appointments. I also discussed with the patient that there may be a patient responsible charge related to this service. The patient expressed understanding and verbally consented to this telephonic visit.    Interactive audio and video telecommunications were attempted between this provider and patient, however failed, due to patient having technical difficulties OR patient did not have access to video capability.  We continued and completed visit with audio only.  Review of Systems    N/A  Cardiac Risk Factors include: advanced age (>68men, >13 women);dyslipidemia     Objective:    There were no vitals filed for this visit. There is no height or weight on file to calculate BMI.  Advanced Directives 04/21/2020 03/13/2019 07/04/2017 07/06/2016 03/26/2016 08/04/2015 07/22/2015  Does Patient Have a Medical Advance Directive? Yes Yes Yes Yes Yes Yes Yes  Type of Estate agent of [place];Living will Healthcare Power of [place];Living will Healthcare Power of [place];Living will Healthcare Power of [place];Living will Living will Healthcare Power of [place];Living will Living will;Healthcare Power of Attorney  Does patient want to make changes to medical advance directive? - - - - - No - Patient declined No - Patient declined  Copy of Healthcare Power of Attorney in Chart? Yes - validated most recent copy scanned in chart (See row information) Yes -  validated most recent copy scanned in chart (See row information) Yes - - No - copy requested No - copy requested    Current Medications (verified) Outpatient Encounter Medications as of 04/21/2020  Medication Sig  . alendronate (FOSAMAX) 70 MG tablet Take with a full glass of water on an empty stomach. (Patient taking differently: Take 70 mg by mouth once a week. Take with a full glass of water on an empty stomach.)  . aspirin 81 MG EC tablet Take 81 mg by mouth daily.  Marland Kitchen CALCIUM CARBONATE-VITAMIN D PO Take 2 tablets by mouth daily.   . Cyanocobalamin (VITAMIN B-12) 5000 MCG TBDP Take 1 tablet by mouth daily at 6 (six) AM.  . FIBER COMPLETE PO Take 2 capsules by mouth daily.   Marland Kitchen latanoprost (XALATAN) 0.005 % ophthalmic solution Place 2 drops into both eyes at bedtime.  . simvastatin (ZOCOR) 20 MG tablet TAKE 1 TABLET BY MOUTH AT BEDTIME  . SHINGRIX injection  (Patient not taking: Reported on 04/21/2020)   No facility-administered encounter medications on file as of 04/21/2020.    Allergies (verified) Patient has no known allergies.   History: Past Medical History:  Diagnosis Date  . Arthritis   . Hyperlipidemia    Past Surgical History:  Procedure Laterality Date  . TONSILLECTOMY AND ADENOIDECTOMY    . TOTAL HIP ARTHROPLASTY Right 08/04/2015   Procedure: RIGHT TOTAL HIP ARTHROPLASTY ANTERIOR APPROACH;  Surgeon: Durene Romans, MD;  Location: [facility];  Service: Orthopedics;  Laterality: Right;  . TUBAL LIGATION     Family History  Problem Relation Age of Onset  . Dementia Mother   . Hypertension Father   . Heart disease Father   . Hyperlipidemia  Sister   . Pancreatic cancer Sister   . Breast cancer Neg Hx    Social History   Socioeconomic History  . Marital status: Married    Spouse name: Not on file  . Number of children: 2  . Years of education: Not on file  . Highest education level: Associate degree: occupational, Scientist, product/process development, or vocational program  Occupational History   . Occupation: retired    Comment: does some part time work at [employer]  . Smoking status: Former Smoker    Quit date: 04/10/1989    Years since quitting: 31.0  . Smokeless tobacco: Never Used  Vaping Use  . Vaping Use: Never used  Substance and Sexual Activity  . Alcohol use: Yes    Alcohol/week: 9.0 standard drinks    Types: 9 Glasses of wine per week    Comment: 1 glass M-F, a couple on the weekend  . Drug use: No  . Sexual activity: Not on file  Other Topics Concern  . Not on file  Social History Narrative  . Not on file   Social Determinants of Health   Financial Resource Strain: Low Risk   . Difficulty of Paying Living Expenses: Not hard at all  Food Insecurity: No Food Insecurity  . Worried About Programme researcher, broadcasting/film/video in the Last Year: Never true  . Ran Out of Food in the Last Year: Never true  Transportation Needs: No Transportation Needs  . Lack of Transportation (Medical): No  . Lack of Transportation (Non-Medical): No  Physical Activity: Insufficiently Active  . Days of Exercise per Week: 7 days  . Minutes of Exercise per Session: 20 min  Stress: No Stress Concern Present  . Feeling of Stress : Only a little  Social Connections: Socially Integrated  . Frequency of Communication with Friends and Family: More than three times a week  . Frequency of Social Gatherings with Friends and Family: Not on file  . Attends Religious Services: More than 4 times per year  . Active Member of Clubs or Organizations: Yes  . Attends Banker Meetings: More than 4 times per year  . Marital Status: Married    Tobacco Counseling Counseling given: Not Answered   Clinical Intake:  Pre-visit preparation completed: Yes  Pain : No/denies pain     Nutritional Risks: None Diabetes: No  How often do you need to have someone help you when you read instructions, pamphlets, or other written materials from your doctor or pharmacy?: 1 - Never  Diabetic?  No  Interpreter Needed?: No  Information entered by :: [facility], LPN   Activities of Daily Living In your present state of health, do you have any difficulty performing the following activities: 04/21/2020 11/15/2019  Hearing? N N  Vision? N N  Difficulty concentrating or making decisions? N N  Walking or climbing stairs? N N  Dressing or bathing? N N  Doing errands, shopping? N N  Preparing Food and eating ? N -  Using the Toilet? N -  In the past six months, have you accidently leaked urine? N -  Do you have problems with loss of bowel control? N -  Managing your Medications? N -  Managing your Finances? N -  Housekeeping or managing your Housekeeping? N -  Some recent data might be hidden    Patient Care Team: Flinchum, Eula Fried, FNP as PCP - General (Family Medicine) Hildred Laser, MD as Referring Physician (Obstetrics and Gynecology) Dasher, Cliffton Asters,  MD as Consulting Physician (Dermatology) Lorelee Cover., MD as Consulting Physician (Ophthalmology)  Indicate any recent Medical Services you may have received from other than [facility] providers in the past year (date may be approximate).     Assessment:   This is a routine wellness examination for [facility].  Hearing/Vision screen No exam data present  Dietary issues and exercise activities discussed: Current Exercise Habits: Home exercise routine, Type of exercise: Other - see comments (rides a stationary bike), Time (Minutes): 20, Frequency (Times/Week): 7, Weekly Exercise (Minutes/Week): 140, Intensity: Mild, Exercise limited by: None identified  Goals    . DIET - INCREASE WATER INTAKE     Recommend increasing water intake to 4-6 glasses a day.       Depression Screen PHQ 2/9 Scores 04/21/2020 11/15/2019 03/13/2019 10/10/2018 08/15/2018 08/15/2018 08/15/2018  PHQ - 2 Score 0 0 1 0 0 0 0  PHQ- 9 Score - 0 - 0 - - -    Fall Risk Fall Risk  04/21/2020 11/15/2019 03/13/2019 10/10/2018 07/04/2017  Falls in the past year? 0 0 0 0 No   Number falls in past yr: 0 0 0 0 -  Injury with Fall? 0 0 0 - -  Follow up - Falls evaluation completed - - -    FALL RISK PREVENTION PERTAINING TO THE HOME:  Any stairs in or around the home? Yes  If so, are there any without handrails? No  Home free of loose throw rugs in walkways, pet beds, electrical cords, etc? Yes  Adequate lighting in your home to reduce risk of falls? Yes   ASSISTIVE DEVICES UTILIZED TO PREVENT FALLS:  Life alert? No  Use of a cane, walker or w/c? No  Grab bars in the bathroom? Yes  Shower chair or bench in shower? Yes  Elevated toilet seat or a handicapped toilet? Yes    Cognitive Function: Normal cognitive status assessed by observation by this Nurse Health Advisor. No abnormalities found.       6CIT Screen 03/13/2019 07/04/2017  What Year? 0 points 0 points  What month? 0 points 0 points  What time? 0 points 0 points  Count back from 20 0 points 0 points  Months in reverse 0 points 0 points  Repeat phrase 0 points 0 points  Total Score 0 0    Immunizations Immunization History  Administered Date(s) Administered  . Fluad Quad(high Dose 65+) 01/30/2019  . Influenza Split 01/16/2009, 01/22/2010, 03/21/2012  . Influenza, High Dose Seasonal PF 04/23/2014, 04/20/2017, 02/28/2018, 01/13/2020  . Influenza,inj,Quad PF,6+ Mos 03/27/2013  . PFIZER SARS-COV-2 Vaccination 05/25/2019, 06/15/2019, 01/09/2020  . Pneumococcal Conjugate-13 04/23/2014  . Pneumococcal Polysaccharide-23 03/18/2011  . Tdap 07/06/2005  . Zoster 01/16/2009    TDAP status: Due, Education has been provided regarding the importance of this vaccine. Advised may receive this vaccine at local pharmacy or Health Dept. Aware to provide a copy of the vaccination record if obtained from local pharmacy or Health Dept. Verbalized acceptance and understanding.  Flu Vaccine status: Up to date  Pneumococcal vaccine status: Up to date  Covid-19 vaccine status: Completed  vaccines  Qualifies for Shingles Vaccine? Yes   Zostavax completed Yes   Shingrix Completed?: No.    Education has been provided regarding the importance of this vaccine. Patient has been advised to call insurance company to determine out of pocket expense if they have not yet received this vaccine. Advised may also receive vaccine at local pharmacy or Health Dept. Verbalized acceptance and understanding.  Screening Tests Health Maintenance  Topic Date Due  . TETANUS/TDAP  04/21/2021 (Originally 07/07/2015)  . DEXA SCAN  11/20/2020  . MAMMOGRAM  12/04/2021  . COLONOSCOPY (Pts 45-3yrs Insurance coverage will need to be confirmed)  07/23/2024  . INFLUENZA VACCINE  Completed  . COVID-19 Vaccine  Completed  . Hepatitis C Screening  Completed  . PNA vac Low Risk Adult  Completed    Health Maintenance  There are no preventive care reminders to display for this patient.  Colorectal cancer screening: Type of screening: Colonoscopy. Completed 07/24/14. Repeat every 10 years  Mammogram status: Completed 12/05/19. Repeat every year  Bone Density status: Completed 11/21/18. Results reflect: Bone density results: OSTEOPOROSIS. Repeat every 2 years.  Lung Cancer Screening: (Low Dose CT Chest recommended if Age 49-80 years, 30 pack-year currently smoking OR have quit w/in 15years.) does not qualify.   Additional Screening:  Hepatitis C Screening: Up to date  Vision Screening: Recommended annual ophthalmology exams for early detection of glaucoma and other disorders of the eye. Is the patient up to date with their annual eye exam?  Yes  Who is the provider or what is the name of the office in which the patient attends annual eye exams? Dr Gloriann Loan If pt is not established with a provider, would they like to be referred to a provider to establish care? No .   Dental Screening: Recommended annual dental exams for proper oral hygiene  Community Resource Referral / Chronic Care Management: CRR  required this visit?  No   CCM required this visit?  No      Plan:     I have personally reviewed and noted the following in the patient's chart:   . Medical and social history . Use of alcohol, tobacco or illicit drugs  . Current medications and supplements . Functional ability and status . Nutritional status . Physical activity . Advanced directives . List of other physicians . Hospitalizations, surgeries, and ER visits in previous 12 months . Vitals . Screenings to include cognitive, depression, and falls . Referrals and appointments  In addition, I have reviewed and discussed with patient certain preventive protocols, quality metrics, and best practice recommendations. A written personalized care plan for preventive services as well as general preventive health recommendations were provided to patient.     Isaly Fasching [place], [place]   7/62/2633   Nurse Notes: None.

## 2020-04-21 ENCOUNTER — Ambulatory Visit (INDEPENDENT_AMBULATORY_CARE_PROVIDER_SITE_OTHER): Payer: PPO

## 2020-04-21 ENCOUNTER — Other Ambulatory Visit: Payer: Self-pay

## 2020-04-21 DIAGNOSIS — Z0000 Encounter for general adult medical examination without abnormal findings: Secondary | ICD-10-CM

## 2020-04-21 NOTE — Patient Instructions (Signed)
[name] , Thank you for taking time to come for your Medicare Wellness Visit. I appreciate your ongoing commitment to your health goals. Please review the following plan we discussed and let me know if I can assist you in the future.   Screening recommendations/referrals: Colonoscopy: Up to date, due 07/2024 Mammogram: Up to date, due 11/2020 Bone Density: Up to date, due 11/2020 Recommended yearly ophthalmology/optometry visit for glaucoma screening and checkup Recommended yearly dental visit for hygiene and checkup  Vaccinations: Influenza vaccine: Done 01/13/20 Pneumococcal vaccine: Completed series Tdap vaccine: Currently due, declined receiving. Shingles vaccine: Shingrix discussed. Please contact your pharmacy for coverage information.     Advanced directives: Currently on file.  Conditions/risks identified: Recommend to increase water intake to 6-8 8 oz glasses a day.  Next appointment: 05/20/20 @ 9:00 AM with [name]    Preventive Care 75 Years and Older, Female Preventive care refers to lifestyle choices and visits with your health care provider that can promote health and wellness. What does preventive care include?  A yearly physical exam. This is also called an annual well check.  Dental exams once or twice a year.  Routine eye exams. Ask your health care provider how often you should have your eyes checked.  Personal lifestyle choices, including:  Daily care of your teeth and gums.  Regular physical activity.  Eating a healthy diet.  Avoiding tobacco and drug use.  Limiting alcohol use.  Practicing safe sex.  Taking low-dose aspirin every day.  Taking vitamin and mineral supplements as recommended by your health care provider. What happens during an annual well check? The services and screenings done by your health care provider during your annual well check will depend on your age, overall health, lifestyle risk factors, and family history of  disease. Counseling  Your health care provider may ask you questions about your:  Alcohol use.  Tobacco use.  Drug use.  Emotional well-being.  Home and relationship well-being.  Sexual activity.  Eating habits.  History of falls.  Memory and ability to understand (cognition).  Work and work Statistician.  Reproductive health. Screening  You may have the following tests or measurements:  Height, weight, and BMI.  Blood pressure.  Lipid and cholesterol levels. These may be checked every 5 years, or more frequently if you are over 75 years old.  Skin check.  Lung cancer screening. You may have this screening every year starting at age 28 if you have a 30-pack-year history of smoking and currently smoke or have quit within the past 15 years.  Fecal occult blood test (FOBT) of the stool. You may have this test every year starting at age 48.  Flexible sigmoidoscopy or colonoscopy. You may have a sigmoidoscopy every 5 years or a colonoscopy every 10 years starting at age 75.  Hepatitis C blood test.  Hepatitis B blood test.  Sexually transmitted disease (STD) testing.  Diabetes screening. This is done by checking your blood sugar (glucose) after you have not eaten for a while (fasting). You may have this done every 1-3 years.  Bone density scan. This is done to screen for osteoporosis. You may have this done starting at age 18.  Mammogram. This may be done every 1-2 years. Talk to your health care provider about how often you should have regular mammograms. Talk with your health care provider about your test results, treatment options, and if necessary, the need for more tests. Vaccines  Your health care provider may recommend certain  vaccines, such as:  Influenza vaccine. This is recommended every year.  Tetanus, diphtheria, and acellular pertussis (Tdap, Td) vaccine. You may need a Td booster every 10 years.  Zoster vaccine. You may need this after age  59.  Pneumococcal 13-valent conjugate (PCV13) vaccine. One dose is recommended after age 59.  Pneumococcal polysaccharide (PPSV23) vaccine. One dose is recommended after age 21. Talk to your health care provider about which screenings and vaccines you need and how often you need them. This information is not intended to replace advice given to you by your health care provider. Make sure you discuss any questions you have with your health care provider. Document Released: 04/24/2015 Document Revised: 12/16/2015 Document Reviewed: 01/27/2015 Elsevier Interactive Patient Education  2017 [place] Prevention in the Home Falls can cause injuries. They can happen to people of all ages. There are many things you can do to make your home safe and to help prevent falls. What can I do on the outside of my home?  Regularly fix the edges of walkways and driveways and fix any cracks.  Remove anything that might make you trip as you walk through a door, such as a raised step or threshold.  Trim any bushes or trees on the path to your home.  Use bright outdoor lighting.  Clear any walking paths of anything that might make someone trip, such as rocks or tools.  Regularly check to see if handrails are loose or broken. Make sure that both sides of any steps have handrails.  Any raised decks and porches should have guardrails on the edges.  Have any leaves, snow, or ice cleared regularly.  Use sand or salt on walking paths during winter.  Clean up any spills in your garage right away. This includes oil or grease spills. What can I do in the bathroom?  Use night lights.  Install grab bars by the toilet and in the tub and shower. Do not use towel bars as grab bars.  Use non-skid mats or decals in the tub or shower.  If you need to sit down in the shower, use a plastic, non-slip stool.  Keep the floor dry. Clean up any water that spills on the floor as soon as it happens.  Remove  soap buildup in the tub or shower regularly.  Attach bath mats securely with double-sided non-slip rug tape.  Do not have throw rugs and other things on the floor that can make you trip. What can I do in the bedroom?  Use night lights.  Make sure that you have a light by your bed that is easy to reach.  Do not use any sheets or blankets that are too big for your bed. They should not hang down onto the floor.  Have a firm chair that has side arms. You can use this for support while you get dressed.  Do not have throw rugs and other things on the floor that can make you trip. What can I do in the kitchen?  Clean up any spills right away.  Avoid walking on wet floors.  Keep items that you use a lot in easy-to-reach places.  If you need to reach something above you, use a strong step stool that has a grab bar.  Keep electrical cords out of the way.  Do not use floor polish or wax that makes floors slippery. If you must use wax, use non-skid floor wax.  Do not have throw rugs and other things  on the floor that can make you trip. What can I do with my stairs?  Do not leave any items on the stairs.  Make sure that there are handrails on both sides of the stairs and use them. Fix handrails that are broken or loose. Make sure that handrails are as long as the stairways.  Check any carpeting to make sure that it is firmly attached to the stairs. Fix any carpet that is loose or worn.  Avoid having throw rugs at the top or bottom of the stairs. If you do have throw rugs, attach them to the floor with carpet tape.  Make sure that you have a light switch at the top of the stairs and the bottom of the stairs. If you do not have them, ask someone to add them for you. What else can I do to help prevent falls?  Wear shoes that:  Do not have high heels.  Have rubber bottoms.  Are comfortable and fit you well.  Are closed at the toe. Do not wear sandals.  If you use a  stepladder:  Make sure that it is fully opened. Do not climb a closed stepladder.  Make sure that both sides of the stepladder are locked into place.  Ask someone to hold it for you, if possible.  Clearly mark and make sure that you can see:  Any grab bars or handrails.  First and last steps.  Where the edge of each step is.  Use tools that help you move around (mobility aids) if they are needed. These include:  Canes.  Walkers.  Scooters.  Crutches.  Turn on the lights when you go into a dark area. Replace any light bulbs as soon as they burn out.  Set up your furniture so you have a clear path. Avoid moving your furniture around.  If any of your floors are uneven, fix them.  If there are any pets around you, be aware of where they are.  Review your medicines with your doctor. Some medicines can make you feel dizzy. This can increase your chance of falling. Ask your doctor what other things that you can do to help prevent falls. This information is not intended to replace advice given to you by your health care provider. Make sure you discuss any questions you have with your health care provider. Document Released: 01/22/2009 Document Revised: 09/03/2015 Document Reviewed: 05/02/2014 Elsevier Interactive Patient Education  2017 [employer].

## 2020-05-05 ENCOUNTER — Other Ambulatory Visit: Payer: Self-pay | Admitting: Family Medicine

## 2020-05-05 DIAGNOSIS — E7800 Pure hypercholesterolemia, unspecified: Secondary | ICD-10-CM

## 2020-05-20 ENCOUNTER — Ambulatory Visit: Payer: Self-pay | Admitting: Adult Health

## 2020-06-17 DIAGNOSIS — H40153 Residual stage of open-angle glaucoma, bilateral: Secondary | ICD-10-CM | POA: Diagnosis not present

## 2020-08-05 ENCOUNTER — Other Ambulatory Visit: Payer: Self-pay | Admitting: Family Medicine

## 2020-08-05 DIAGNOSIS — E7800 Pure hypercholesterolemia, unspecified: Secondary | ICD-10-CM

## 2020-10-09 DIAGNOSIS — J069 Acute upper respiratory infection, unspecified: Secondary | ICD-10-CM | POA: Diagnosis not present

## 2020-10-28 DIAGNOSIS — H40153 Residual stage of open-angle glaucoma, bilateral: Secondary | ICD-10-CM | POA: Diagnosis not present

## 2020-11-02 ENCOUNTER — Other Ambulatory Visit: Payer: Self-pay | Admitting: Family Medicine

## 2020-11-02 DIAGNOSIS — E7800 Pure hypercholesterolemia, unspecified: Secondary | ICD-10-CM

## 2020-11-02 DIAGNOSIS — M8580 Other specified disorders of bone density and structure, unspecified site: Secondary | ICD-10-CM

## 2020-11-02 DIAGNOSIS — Z9189 Other specified personal risk factors, not elsewhere classified: Secondary | ICD-10-CM

## 2020-11-18 ENCOUNTER — Encounter: Payer: PPO | Admitting: Adult Health

## 2020-12-31 ENCOUNTER — Telehealth: Payer: Self-pay

## 2020-12-31 NOTE — Telephone Encounter (Signed)
Copied from [place] [phone]. Topic: General - Other >> Dec 31, 2020  4:02 PM Jodie Echevaria wrote: [phone] for CRM: Patient called in and is scheduled for her Physical on 01/26/21 with Dr Caryn Section but would like to come in and have her labs done before this visit since its late in the day. Please call when orders have been placed to notify patient. Ph# [phone]

## 2021-01-01 NOTE — Telephone Encounter (Signed)
Pt has been r/s with elise 01/27/21 10am

## 2021-01-01 NOTE — Telephone Encounter (Signed)
I'm not accepting new patients. Her CPE needs to move to Elise's schedule.

## 2021-01-06 ENCOUNTER — Encounter: Payer: PPO | Admitting: Adult Health

## 2021-01-20 IMAGING — MG DIGITAL SCREENING BILATERAL MAMMOGRAM WITH TOMO AND CAD
6 of 12 series · 6 of 36 positions shown · non-contrast
Comparison: Previous exam(s).

CLINICAL DATA: Screening.

EXAM:
DIGITAL SCREENING BILATERAL MAMMOGRAM WITH TOMO AND CAD

[L CC synth-2D (1 of 2)]
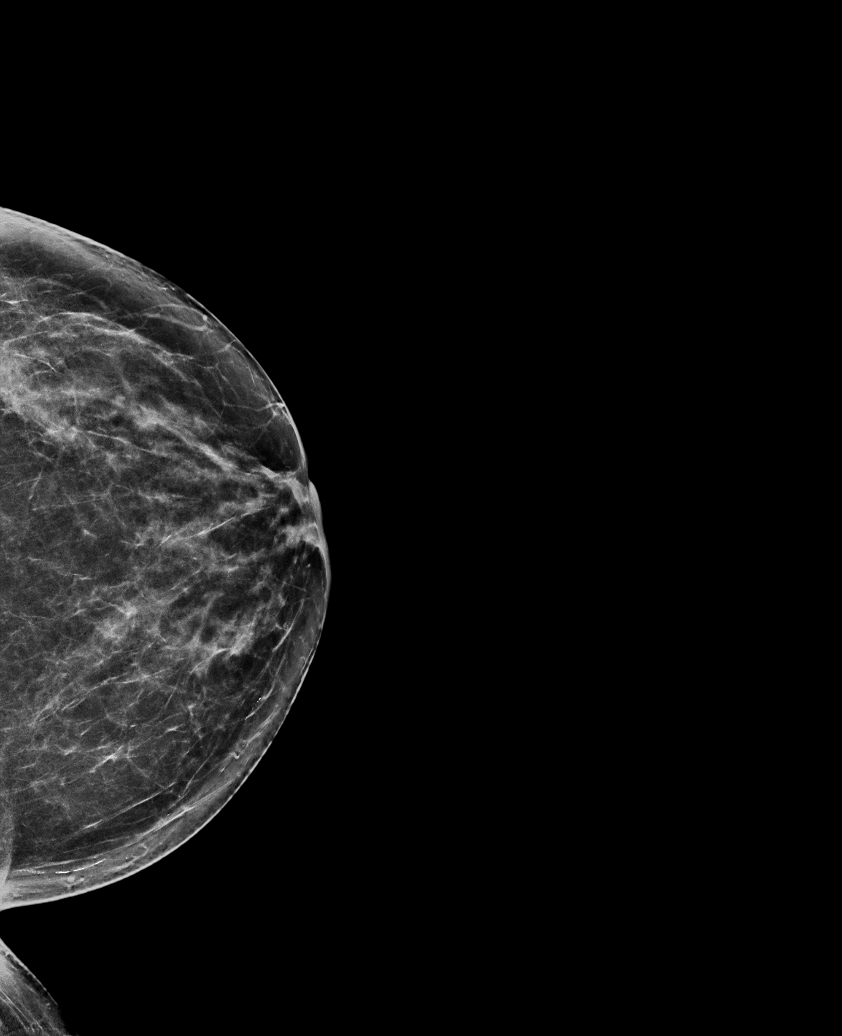

[L CC synth-2D (2 of 2)]
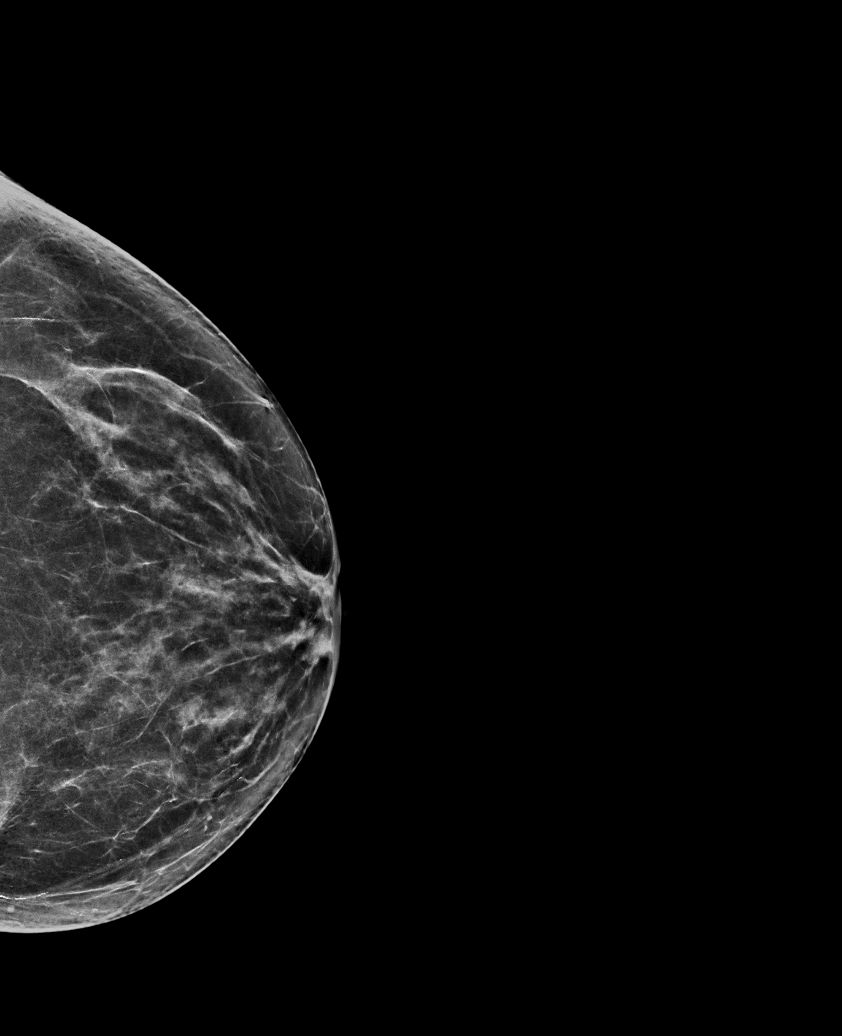

[L MLO synth-2D (1 of 2)]
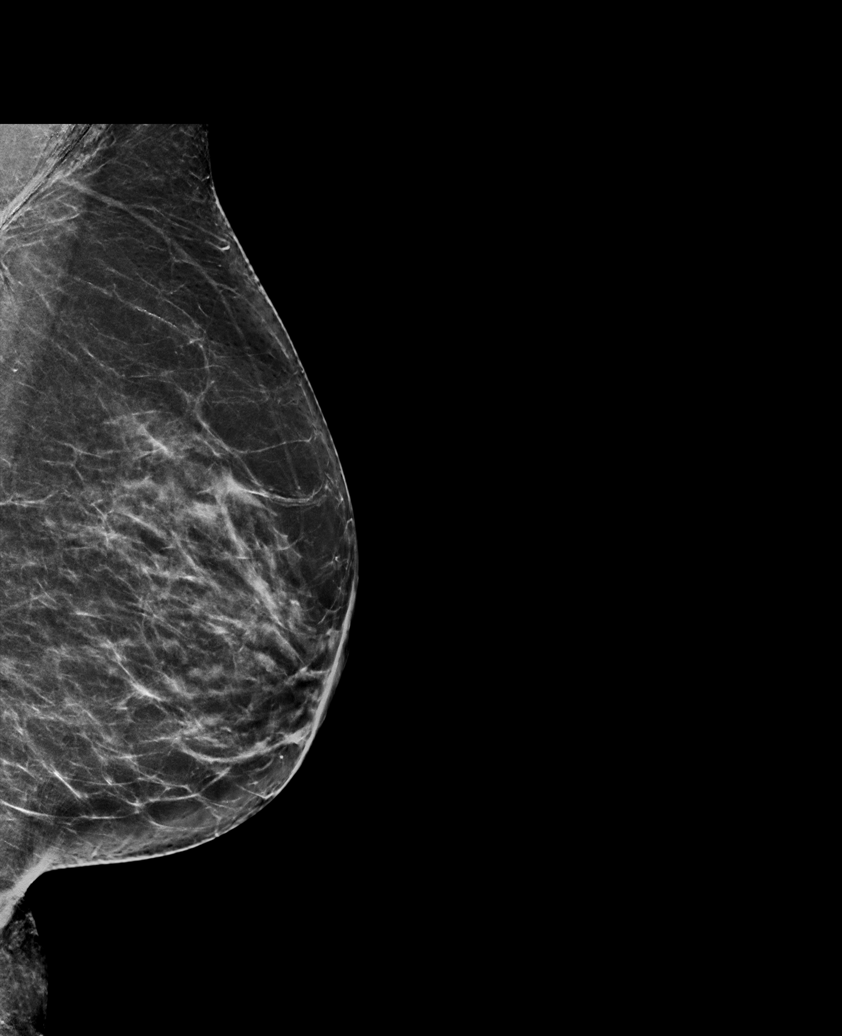

[R MLO synth-2D]
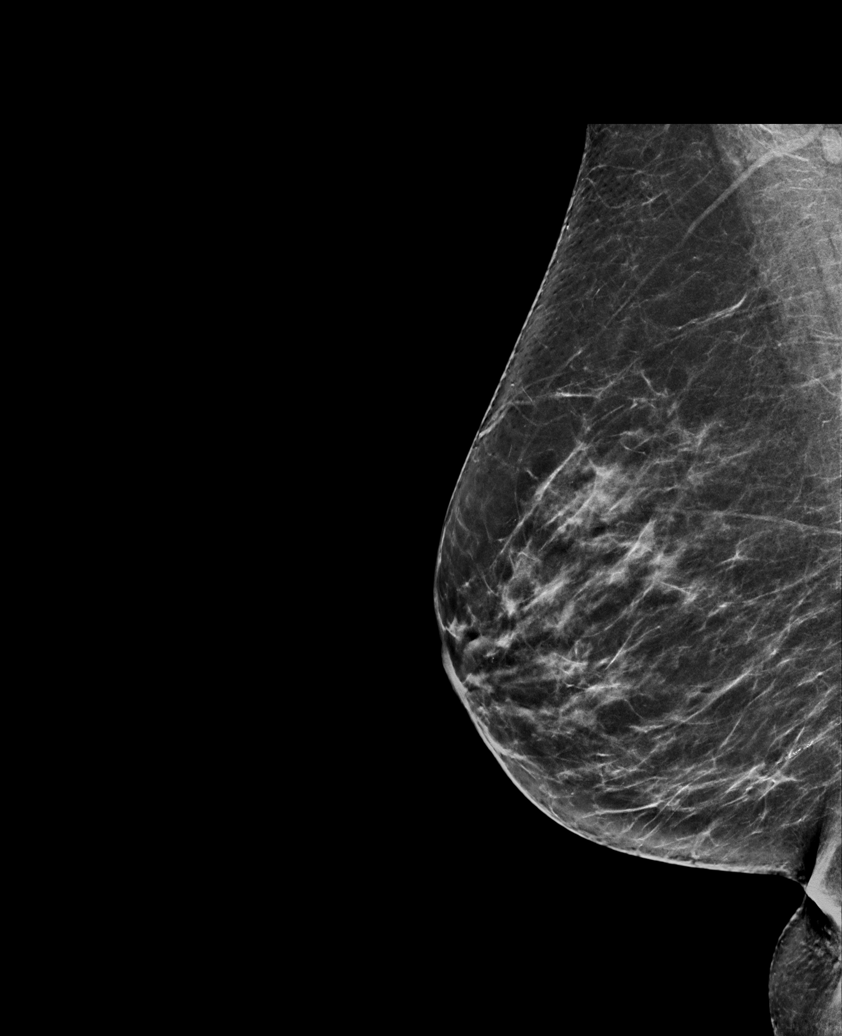

[L MLO synth-2D (2 of 2)]
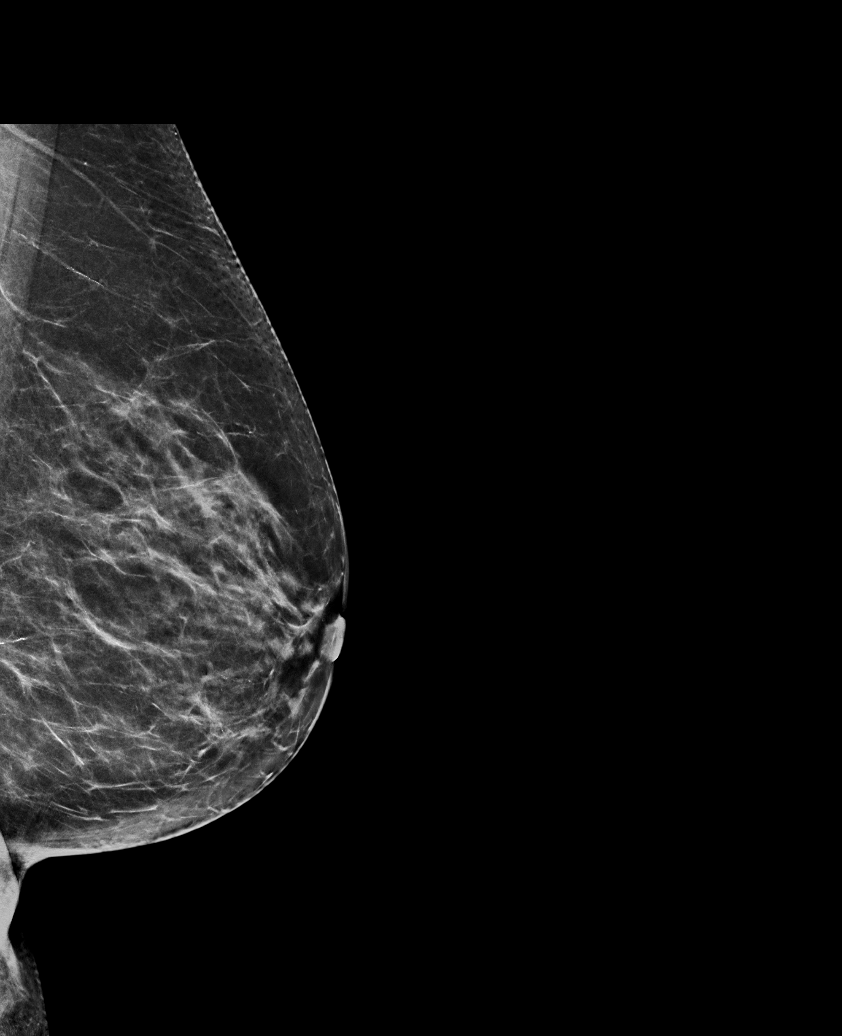

[R CC synth-2D]
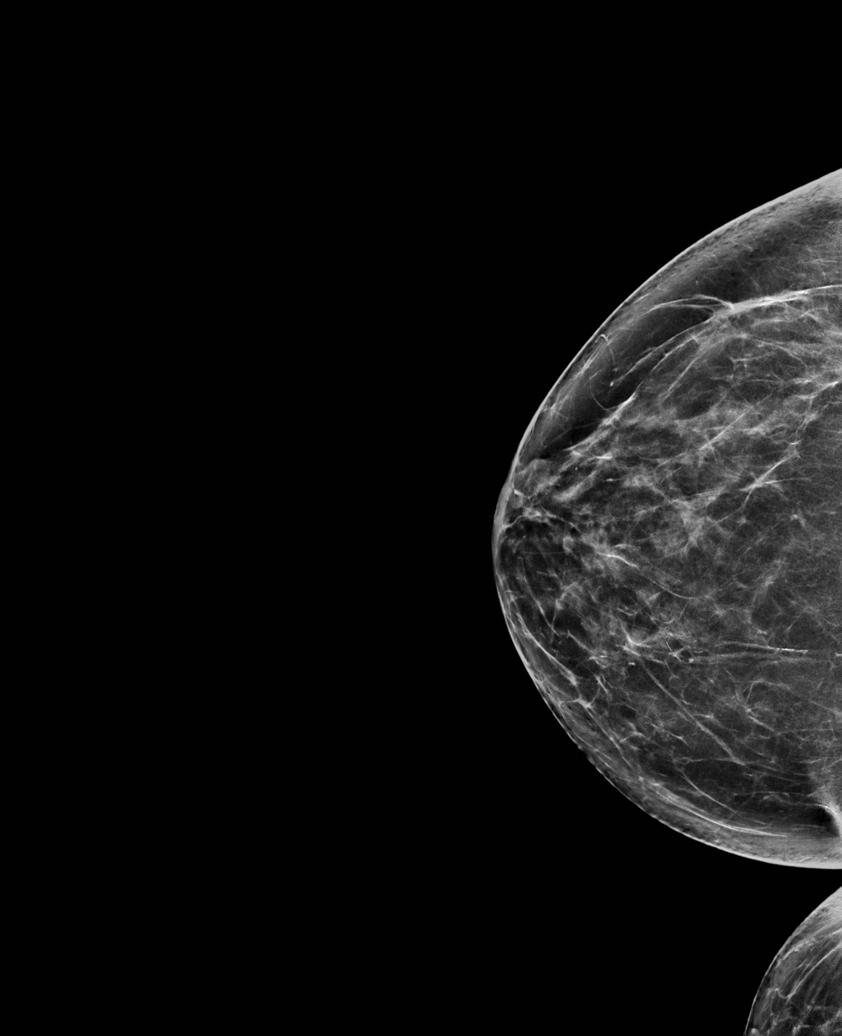

[6 of 36 positions shown; findings below may reference images not displayed]

ACR Breast Density Category c: The breast tissue is heterogeneously
dense, which may obscure small masses.
FINDINGS: There are no findings suspicious for malignancy. Images were
processed with CAD.
IMPRESSION: No mammographic evidence of malignancy. A result letter of this
screening mammogram will be mailed directly to the patient.

RECOMMENDATION:
Screening mammogram in one year. (Code:[id number])

BI-RADS CATEGORY  1: Negative.

## 2021-01-26 ENCOUNTER — Encounter: Payer: PPO | Admitting: Family Medicine

## 2021-01-27 ENCOUNTER — Encounter: Payer: Self-pay | Admitting: Family Medicine

## 2021-01-27 ENCOUNTER — Telehealth: Payer: Self-pay

## 2021-01-27 ENCOUNTER — Ambulatory Visit (INDEPENDENT_AMBULATORY_CARE_PROVIDER_SITE_OTHER): Payer: PPO | Admitting: Family Medicine

## 2021-01-27 ENCOUNTER — Other Ambulatory Visit: Payer: Self-pay

## 2021-01-27 VITALS — BP 145/70 | HR 63 | Temp 97.7°F | Resp 16 | Ht 63.0 in | Wt 134.0 lb

## 2021-01-27 DIAGNOSIS — Z1231 Encounter for screening mammogram for malignant neoplasm of breast: Secondary | ICD-10-CM

## 2021-01-27 DIAGNOSIS — E7800 Pure hypercholesterolemia, unspecified: Secondary | ICD-10-CM

## 2021-01-27 DIAGNOSIS — N644 Mastodynia: Secondary | ICD-10-CM

## 2021-01-27 DIAGNOSIS — Z23 Encounter for immunization: Secondary | ICD-10-CM

## 2021-01-27 DIAGNOSIS — Z9189 Other specified personal risk factors, not elsewhere classified: Secondary | ICD-10-CM | POA: Diagnosis not present

## 2021-01-27 DIAGNOSIS — Z0000 Encounter for general adult medical examination without abnormal findings: Secondary | ICD-10-CM | POA: Diagnosis not present

## 2021-01-27 DIAGNOSIS — R300 Dysuria: Secondary | ICD-10-CM

## 2021-01-27 DIAGNOSIS — H6123 Impacted cerumen, bilateral: Secondary | ICD-10-CM | POA: Diagnosis not present

## 2021-01-27 DIAGNOSIS — M8580 Other specified disorders of bone density and structure, unspecified site: Secondary | ICD-10-CM | POA: Diagnosis not present

## 2021-01-27 DIAGNOSIS — R799 Abnormal finding of blood chemistry, unspecified: Secondary | ICD-10-CM | POA: Diagnosis not present

## 2021-01-27 LAB — POCT URINALYSIS DIPSTICK
Bilirubin, UA: NEGATIVE
Glucose, UA: NEGATIVE
Ketones, UA: NEGATIVE
Nitrite, UA: NEGATIVE
Protein, UA: POSITIVE — AB
Spec Grav, UA: <=1.005 — AB (ref 1.010–1.025)
Urobilinogen, UA: 0.2 E.U./dL
pH, UA: 6.5 (ref 5.0–8.0)

## 2021-01-27 MED ORDER — NITROFURANTOIN MONOHYD MACRO 100 MG PO CAPS: 100.0000 mg | ORAL_CAPSULE | Freq: Two times a day (BID) | ORAL | 0 refills | Status: DC

## 2021-01-27 NOTE — Assessment & Plan Note (Signed)
Reports some soreness; no lumps/bumps/other changes to L breast- since [place] covid shot

## 2021-01-27 NOTE — Assessment & Plan Note (Signed)
Repeat chemistry today

## 2021-01-27 NOTE — Telephone Encounter (Signed)
Patient called the office stating that she is upset. She says she was here for her physical today and told the provider that she was having tenderness in her left breast. Patient says that she was due for a mammogram.  Patient says the provider told her that she could call and schedule the mammogram appointment herself. Patient states she called [facility] to try scheduling her appointment and was told that she could not do so because she was having a new breast problem. Patient says that the order for screening mammogram will not work because she has a new problem. Patient wants this taken care of ASAP. She says she is overdue for her mammogram. It sounds like the order needs to be changed to a diagnostic mammogram, and we have to schedule the mammogram appointment for the patient. Patient prefers a mammogram appt after 2pm. She is not available on 02/02/2021 because she has another doctors appointment on that day.   Also, patient is upset because her AVS didn't include her vital signs. She says her AVS usually includes her vitals and she likes to keep them for her record.

## 2021-01-27 NOTE — Progress Notes (Signed)
Complete physical exam   Patient: Amanda Walton   DOB: 1945/06/14   75 y.o. Female  MRN: [id number] Visit Date: 01/27/2021  Today's healthcare provider: Gwyneth Sprout, FNP   Chief Complaint  Patient presents with   Annual Exam   Subjective      HPI  Amanda Walton is a 75 y.o. female who presents today for a complete physical exam.  She reports consuming a general diet. Home exercise routine includes exercise bicycle 39min daily. She generally feels fairly well. She reports sleeping fairly well. She does have additional problems to discuss today, patient reports that she has bene experiencing dysuria and frequency for 7 days or more.  Past Medical History:  Diagnosis Date   Arthritis    Hyperlipidemia    Past Surgical History:  Procedure Laterality Date   TONSILLECTOMY AND ADENOIDECTOMY     TOTAL HIP ARTHROPLASTY Right 08/04/2015   Procedure: RIGHT TOTAL HIP ARTHROPLASTY ANTERIOR APPROACH;  Surgeon: Paralee Cancel, MD;  Location: [facility];  Service: Orthopedics;  Laterality: Right;   TUBAL LIGATION     Social History   Socioeconomic History   Marital status: Married    Spouse name: Not on file   Number of children: 2   Years of education: Not on file   Highest education level: Associate degree: occupational, Hotel manager, or vocational program  Occupational History   Occupation: retired    Comment: does some part time work at [employer]  Tobacco Use   Smoking status: Former    Types: Cigarettes    Quit date: 04/10/1989    Years since quitting: 31.8   Smokeless tobacco: Never  Vaping Use   Vaping Use: Never used  Substance and Sexual Activity   Alcohol use: Yes    Alcohol/week: 9.0 standard drinks    Types: 9 Glasses of wine per week    Comment: 1 glass M-F, a couple on the weekend   Drug use: No   Sexual activity: Not on file  Other Topics Concern   Not on file  Social History Narrative   Not on file   Social Determinants of Health   Financial Resource  Strain: Low Risk    Difficulty of Paying Living Expenses: Not hard at all  Food Insecurity: No Food Insecurity   Worried About Charity fundraiser in the Last Year: Never true   [place] in the Last Year: Never true  Transportation Needs: No Transportation Needs   Lack of Transportation (Medical): No   Lack of Transportation (Non-Medical): No  Physical Activity: Insufficiently Active   Days of Exercise per Week: 7 days   Minutes of Exercise per Session: 20 min  Stress: No Stress Concern Present   Feeling of Stress : Only a little  Social Connections: Engineer, building services of Communication with Friends and Family: More than three times a week   Frequency of Social Gatherings with Friends and Family: Not on file   Attends Religious Services: More than 4 times per year   Active Member of [employer] or Organizations: Yes   Attends Music therapist: More than 4 times per year   Marital Status: Married  Human resources officer Violence: Not At Risk   Fear of Current or Ex-Partner: No   Emotionally Abused: No   Physically Abused: No   Sexually Abused: No   Family Status  Relation Name Status   Mother  Deceased at age 6   Father  Alive  Sister  Alive   Sister  Deceased at age 31   Neg Hx  (Not Specified)   Family History  Problem Relation Age of Onset   Dementia Mother    Hypertension Father    Heart disease Father    Hyperlipidemia Sister    Pancreatic cancer Sister    Breast cancer Neg Hx    No Known Allergies  Patient Care Team: Gwyneth Sprout, FNP as PCP - General (Family Medicine) Rubie Maid, MD as Referring Physician (Obstetrics and Gynecology) Dasher, Rayvon Char, MD as Consulting Physician (Dermatology) Lorelee Cover., MD as Consulting Physician (Ophthalmology)   Medications: Outpatient Medications Prior to Visit  Medication Sig   alendronate (FOSAMAX) 70 MG tablet TAKE ONE TABLET BY MOUTH EACH WEEK, ON AN EMPTY STOMACH BEFORE BREAKFAST WITH  8oz OF WATER AND REMAIN UPRIGHT FOR :30   CALCIUM CARBONATE-VITAMIN D PO Take 2 tablets by mouth daily.    Cyanocobalamin (VITAMIN B-12) 5000 MCG TBDP Take 1 tablet by mouth daily at 6 (six) AM.   FIBER COMPLETE PO Take 2 capsules by mouth daily.    latanoprost (XALATAN) 0.005 % ophthalmic solution Place 2 drops into both eyes at bedtime.   simvastatin (ZOCOR) 20 MG tablet TAKE 1 TABLET BY MOUTH AT BEDTIME   [DISCONTINUED] aspirin 81 MG EC tablet Take 81 mg by mouth daily.   [DISCONTINUED] SHINGRIX injection  (Patient not taking: Reported on 04/21/2020)   No facility-administered medications prior to visit.    Review of Systems  Genitourinary:  Positive for dysuria.  All other systems reviewed and are negative.    Objective    There were no vitals taken for this visit.   Physical Exam Vitals and nursing note reviewed.  Constitutional:      General: She is awake. She is not in acute distress.    Appearance: Normal appearance. She is well-developed, well-groomed and normal weight. She is not ill-appearing, toxic-appearing or diaphoretic.  HENT:     Head: Normocephalic and atraumatic.     Jaw: There is normal jaw occlusion. No trismus, tenderness, swelling or pain on movement.     Right Ear: Hearing normal. There is impacted cerumen.     Left Ear: Hearing normal. There is impacted cerumen.     Nose: Nose normal. No congestion or rhinorrhea.     Right Turbinates: Not enlarged, swollen or pale.     Left Turbinates: Not enlarged, swollen or pale.     Right Sinus: No maxillary sinus tenderness or frontal sinus tenderness.     Left Sinus: No maxillary sinus tenderness or frontal sinus tenderness.     Mouth/Throat:     Lips: Pink.     Mouth: Mucous membranes are moist. No injury.     Tongue: No lesions.     Pharynx: Oropharynx is clear. Uvula midline. No pharyngeal swelling, oropharyngeal exudate, posterior oropharyngeal erythema or uvula swelling.     Tonsils: No tonsillar exudate or  tonsillar abscesses.  Eyes:     General: Lids are normal. Lids are everted, no foreign bodies appreciated. Vision grossly intact. Gaze aligned appropriately. No allergic shiner or visual field deficit.       Right eye: No discharge.        Left eye: No discharge.     Extraocular Movements: Extraocular movements intact.     Conjunctiva/sclera: Conjunctivae normal.     Right eye: Right conjunctiva is not injected. No exudate.    Left eye: Left conjunctiva is not injected.  No exudate.    Pupils: Pupils are equal, round, and reactive to light.  Neck:     Thyroid: No thyroid mass, thyromegaly or thyroid tenderness.     Vascular: No carotid bruit.     Trachea: Trachea normal.  Cardiovascular:     Rate and Rhythm: Normal rate and regular rhythm.     Pulses: Normal pulses.          Carotid pulses are 2+ on the right side and 2+ on the left side.      Radial pulses are 2+ on the right side and 2+ on the left side.       Dorsalis pedis pulses are 2+ on the right side and 2+ on the left side.       Posterior tibial pulses are 2+ on the right side and 2+ on the left side.     Heart sounds: Normal heart sounds, S1 normal and S2 normal. No murmur heard.   No friction rub. No gallop.  Pulmonary:     Effort: Pulmonary effort is normal. No respiratory distress.     Breath sounds: Normal breath sounds and air entry. No stridor. No wheezing, rhonchi or rales.  Chest:     Chest wall: No tenderness.     Comments: Breast exam deferred; discussed 'know your lemons' campaign and self exam Abdominal:     General: Abdomen is flat. Bowel sounds are normal. There is no distension.     Palpations: Abdomen is soft. There is no mass.     Tenderness: There is no abdominal tenderness. There is no right CVA tenderness, left CVA tenderness, guarding or rebound.     Hernia: No hernia is present.  Genitourinary:    Comments: Exam deferred; denies complaints beyond dysuria Musculoskeletal:        General: No  swelling, tenderness, deformity or signs of injury. Normal range of motion.     Cervical back: Full passive range of motion without pain, normal range of motion and neck supple. No edema, rigidity or tenderness. No muscular tenderness.     Right lower leg: No edema.     Left lower leg: No edema.  Lymphadenopathy:     Cervical: No cervical adenopathy.     Right cervical: No superficial, deep or posterior cervical adenopathy.    Left cervical: No superficial, deep or posterior cervical adenopathy.  Skin:    General: Skin is warm and dry.     Capillary Refill: Capillary refill takes less than 2 seconds.     Coloration: Skin is not jaundiced or pale.     Findings: No bruising, erythema, lesion or rash.  Neurological:     General: No focal deficit present.     Mental Status: She is alert and oriented to person, place, and time. Mental status is at baseline.     GCS: GCS eye subscore is 4. GCS verbal subscore is 5. GCS motor subscore is 6.     Sensory: Sensation is intact. No sensory deficit.     Motor: Motor function is intact. No weakness.     Coordination: Coordination is intact. Coordination normal.     Gait: Gait is intact. Gait normal.  Psychiatric:        Attention and Perception: Attention and perception normal.        Mood and Affect: Mood and affect normal.        Speech: Speech normal.        Behavior: Behavior normal. Behavior is cooperative.  Thought Content: Thought content normal.        Cognition and Memory: Cognition and memory normal.        Judgment: Judgment normal.    Last depression screening scores PHQ 2/9 Scores 04/21/2020 11/15/2019 03/13/2019  PHQ - 2 Score 0 0 1  PHQ- 9 Score - 0 -   Last fall risk screening Fall Risk  04/21/2020  Falls in the past year? 0  Number falls in past yr: 0  Injury with Fall? 0  Follow up -   Last Audit-C alcohol use screening Alcohol Use Disorder Test (AUDIT) 04/21/2020  1. How often do you have a drink containing alcohol? 4   2. How many drinks containing alcohol do you have on a typical day when you are drinking? 0  3. How often do you have six or more drinks on one occasion? 0  AUDIT-C Score 4  4. How often during the last year have you found that you were not able to stop drinking once you had started? 0  5. How often during the last year have you failed to do what was normally expected from you because of drinking? 0  6. How often during the last year have you needed a first drink in the morning to get yourself going after a heavy drinking session? 0  7. How often during the last year have you had a feeling of guilt of remorse after drinking? 0  8. How often during the last year have you been unable to remember what happened the night before because you had been drinking? 0  9. Have you or someone else been injured as a result of your drinking? 0  10. Has a relative or friend or a doctor or another health worker been concerned about your drinking or suggested you cut down? 0  Alcohol Use Disorder Identification Test Final Score (AUDIT) 4  Alcohol Brief Interventions/Follow-up AUDIT Score <7 follow-up not indicated   A score of 3 or more in women, and 4 or more in men indicates increased risk for alcohol abuse, EXCEPT if all of the points are from question 1   Results for orders placed or performed in visit on 01/27/21  POCT urinalysis dipstick  Result Value Ref Range   Color, UA yellow    Clarity, UA cloudy    Glucose, UA Negative Negative   Bilirubin, UA negative    Ketones, UA negative    Spec Grav, UA <=1.005 (A) 1.010 - 1.025   Blood, UA large    pH, UA 6.5 5.0 - 8.0   Protein, UA Positive (A) Negative   Urobilinogen, UA 0.2 0.2 or 1.0 E.U./dL   Nitrite, UA negative    Leukocytes, UA Large (3+) (A) Negative   Appearance     Odor      Assessment & Plan    Routine Health Maintenance and Physical Exam  Exercise Activities and Dietary recommendations  Goals      DIET - INCREASE WATER INTAKE      Recommend increasing water intake to 4-6 glasses a day.         Immunization History  Administered Date(s) Administered   Fluad Quad(high Dose 65+) 01/30/2019   Influenza Split 01/16/2009, 01/22/2010, 03/21/2012   Influenza, High Dose Seasonal PF 04/23/2014, 04/20/2017, 02/28/2018, 01/13/2020   Influenza,inj,Quad PF,6+ Mos 03/27/2013   PFIZER(Purple Top)SARS-COV-2 Vaccination 05/25/2019, 06/15/2019, 01/09/2020   Pneumococcal Conjugate-13 04/23/2014   Pneumococcal Polysaccharide-23 03/18/2011   Tdap 07/06/2005   Zoster, Live 01/16/2009  Health Maintenance  Topic Date Due   Zoster Vaccines- Shingrix (1 of 2) Never done   COVID-19 Vaccine (4 - Booster for Pfizer series) 05/10/2020   INFLUENZA VACCINE  11/09/2020   DEXA SCAN  11/20/2020   TETANUS/TDAP  04/21/2021 (Originally 07/07/2015)   MAMMOGRAM  12/04/2021   COLONOSCOPY (Pts 45-65yrs Insurance coverage will need to be confirmed)  07/23/2024   Pneumonia Vaccine 41+ Years old  Completed   Hepatitis C Screening  Completed   HPV VACCINES  Aged Out    Discussed health benefits of physical activity, and encouraged her to engage in regular exercise appropriate for her age and condition.   Return in about 6 months (around 07/28/2021) for chonic disease management.    Vonna Kotyk, FNP, have reviewed all documentation for this visit. The documentation on 01/27/21 for the exam, diagnosis, procedures, and orders are all accurate and complete.    Gwyneth Sprout, [place] [phone] (phone) [phone] (fax)  [place]

## 2021-01-27 NOTE — Assessment & Plan Note (Signed)
Up to date on eye/dentist appts Things to do to keep yourself healthy  - Exercise at least 30-45 minutes a day, 3-4 days a week.  - Eat a low-fat diet with lots of fruits and vegetables, up to 7-9 servings per day.  - Seatbelts can save your life. Wear them always.  - Smoke detectors on every level of your home, check batteries every year.  - Eye Doctor - have an eye exam every 1-2 years  - Safe sex - if you may be exposed to STDs, use a condom.  - Alcohol -  If you drink, do it moderately, less than 2 drinks per day.  - [place]. Choose someone to speak for you if you are not able.  - Depression is common in our stressful world.If you're feeling down or losing interest in things you normally enjoy, please come in for a visit.  - Violence - If anyone is threatening or hurting you, please call immediately.

## 2021-01-27 NOTE — Assessment & Plan Note (Signed)
Chronic per pt report Denies changes in hearing Recommend routine use of debrox or similar Cleaned today by CMA

## 2021-01-27 NOTE — Assessment & Plan Note (Signed)
Encouraged increase in water Recommend bladder training/emptying schedule

## 2021-01-27 NOTE — Assessment & Plan Note (Signed)
Pulled for refill 

## 2021-01-27 NOTE — Assessment & Plan Note (Signed)
Repeat lipid panel today. 

## 2021-01-27 NOTE — Assessment & Plan Note (Signed)
Denies fall Continue to monitor

## 2021-01-27 NOTE — Assessment & Plan Note (Signed)
provided ?

## 2021-01-27 NOTE — Assessment & Plan Note (Signed)
recommend diet low in saturated fat and regular exercise - 30 min at least 5 times per week

## 2021-01-28 ENCOUNTER — Other Ambulatory Visit: Payer: Self-pay | Admitting: Family Medicine

## 2021-01-28 DIAGNOSIS — E7800 Pure hypercholesterolemia, unspecified: Secondary | ICD-10-CM

## 2021-01-28 LAB — LIPID PANEL
Chol/HDL Ratio: 3.6 ratio (ref 0.0–4.4)
Cholesterol, Total: 213 mg/dL — ABNORMAL HIGH (ref 100–199)
HDL: 59 mg/dL (ref >39)
LDL Chol Calc (NIH): 135 mg/dL — ABNORMAL HIGH (ref 0–99)
Triglycerides: 109 mg/dL (ref 0–149)
VLDL Cholesterol Cal: 19 mg/dL (ref 5–40)

## 2021-01-28 LAB — BASIC METABOLIC PANEL
BUN/Creatinine Ratio: 22 (ref 12–28)
BUN: 13 mg/dL (ref 8–27)
CO2: 25 mmol/L (ref 20–29)
Calcium: 9.5 mg/dL (ref 8.7–10.3)
Chloride: 104 mmol/L (ref 96–106)
Creatinine, Ser: 0.59 mg/dL (ref 0.57–1.00)
Glucose: 101 mg/dL — ABNORMAL HIGH (ref 70–99)
Potassium: 4.7 mmol/L (ref 3.5–5.2)
Sodium: 141 mmol/L (ref 134–144)
eGFR: 95 mL/min/{1.73_m2} (ref >59)

## 2021-01-28 MED ORDER — ROSUVASTATIN CALCIUM 20 MG PO TABS: 20.0000 mg | ORAL_TABLET | Freq: Every day | ORAL | 3 refills | Status: DC

## 2021-01-28 NOTE — Telephone Encounter (Signed)
Patient requesting a follow up call regarding the below. Patient states she expressed to her PCP her breast were tender and imaging is requesting orders. Patient would like a follow up call today to confirm the orders placed are correct. Patient also mentioned her cholesterol being high and would like PCP to prescribed a general Crestor medication.

## 2021-01-28 NOTE — Telephone Encounter (Signed)
Spoke with patient on phone who states that at visit she expressed that she was having pain in her breast, she admits that Mrs. Rollene Rotunda offered to do a breast exam in office but states that she declined when offered and expressed to Daneil Dan that she was do for mammogram and stated that Mrs. Rollene Rotunda told her that it dosent matter because mammograms are 3D. Patient then stated on phone " I dont give a flying flip I need this order put in today."KW

## 2021-01-29 ENCOUNTER — Telehealth: Payer: Self-pay

## 2021-01-29 NOTE — Telephone Encounter (Signed)
Please see telephone encounter marked 01/28/21, order was placed and patient advised when spoke to. KW

## 2021-01-29 NOTE — Telephone Encounter (Signed)
Copied from [place] [id number]. Topic: General - Other >> Jan 29, 2021 10:48 AM Tessa Lerner A wrote: Reason for CRM: The patient would like to speak with a member of administrative staff regarding a request to have orders for a diagnostic mammogram   The patient would like to further speak with a member of staff about their request and the amount of time needed to process it   Please contact further when available  The patient would like to speak to Judson Roch or [place] further regarding their concerns

## 2021-02-01 ENCOUNTER — Encounter: Payer: Self-pay | Admitting: Family Medicine

## 2021-02-01 LAB — URINE CULTURE

## 2021-02-02 ENCOUNTER — Telehealth: Payer: Self-pay

## 2021-02-02 DIAGNOSIS — L821 Other seborrheic keratosis: Secondary | ICD-10-CM | POA: Diagnosis not present

## 2021-02-02 NOTE — Telephone Encounter (Signed)
Copied from [place] [id number]. Topic: Complaint - Provider (non-sensitive) >> Feb 02, 2021  4:09 PM Celene Kras wrote: Date of Incident: 01/27/21 Details of complaint: Pt states that she had a higher level cholesterol during this visit. She states that she was given a new medication and was not given any follow up instructions to have her cholesterol rechecked in 6 months how previous providers had given her. Please see telephone encounter on 01/27/21 as she mentioned this as well. Please advise.  How would the patient like to see it resolved? Pt states that she is not feeling heard and that she overall does not feel like her needs have been met. She states that she does not feel comfortable seeing PCP anymore due to not having a overall great experience as previous encounters with other providers. Pt states that she feels that if she cannot find a provider who actually cares about her health and willing to communicate with her then she will reluctantly have to find another office.  On a scale of 1-10, how was your experience? 0 What would it take to bring it to a 10? Pt states that she would like to have the Dr. Venia Minks experience when she comes in every time. Please advise.   Route to Engineer, building services.

## 2021-02-02 NOTE — Telephone Encounter (Signed)
Copied from CRM #[id number]. Topic: Complaint - Provider (non-sensitive) >> Feb 02, 2021  4:09 PM Williams, Olivia wrote: Date of Incident: 01/27/21 Details of complaint: Pt states that she had a higher level cholesterol during this visit. She states that she was given a new medication and was not given any follow up instructions to have her cholesterol rechecked in 6 months how previous providers had given her. Please see telephone encounter on 01/27/21 as she mentioned this as well. Please advise.  How would the patient like to see it resolved? Pt states that she is not feeling heard and that she overall does not feel like her needs have been met. She states that she does not feel comfortable seeing PCP anymore due to not having a overall great experience as previous encounters with other providers. Pt states that she feels that if she cannot find a provider who actually cares about her health and willing to communicate with her then she will reluctantly have to find another office.  On a scale of 1-10, how was your experience? 0 What would it take to bring it to a 10? Pt states that she would like to have the Dr. Maloney experience when she comes in every time. Please advise.   Route to Practice Administrator. 

## 2021-02-08 NOTE — Telephone Encounter (Signed)
Copied from [place] [id number]. Topic: General - Call Back - No Documentation >> Feb 08, 2021  2:03 PM Erick Blinks wrote: Reason for CRM: Pt called requesting to speak to the office about her last appt. Has questions from her POA. Best contact: [phone]

## 2021-02-09 ENCOUNTER — Telehealth: Payer: Self-pay

## 2021-02-09 NOTE — Telephone Encounter (Signed)
FYI-Patient called requesting an appointment with a female doctor, reports that she does not want to see the provider she saw last time ever again. She has been coming to the practice for 15 years and is not happy. She needs to be follow since she was started on Crestor. Patient scheduled with Dr.B 05/17/2021

## 2021-02-11 ENCOUNTER — Ambulatory Visit
Admission: RE | Admit: 2021-02-11 | Discharge: 2021-02-11 | Disposition: A | Discharge status: Home / Self Care | Payer: PPO | Source: Ambulatory Visit | Attending: Family Medicine | Admitting: Family Medicine

## 2021-02-11 ENCOUNTER — Other Ambulatory Visit: Payer: PPO

## 2021-02-11 ENCOUNTER — Other Ambulatory Visit: Payer: Self-pay

## 2021-02-11 DIAGNOSIS — Z1231 Encounter for screening mammogram for malignant neoplasm of breast: Secondary | ICD-10-CM

## 2021-02-11 DIAGNOSIS — N644 Mastodynia: Secondary | ICD-10-CM | POA: Insufficient documentation

## 2021-02-11 DIAGNOSIS — R922 Inconclusive mammogram: Secondary | ICD-10-CM | POA: Diagnosis not present

## 2021-02-11 NOTE — Telephone Encounter (Signed)
I am not taking on any new patients at this time. Can see Ria Comment PA if she would like.  Appointment should not be scheduled to switch to new PCP without consent of the providers. Please update appt that was scheduled to Ria Comment if she decides to see her.  In the future, if we hire another physician, or have more availability, she may be able to switch to an MD/DO at that point.

## 2021-02-12 NOTE — Telephone Encounter (Signed)
Patient advised. Patient reports she was seeing Dr. Brita Romp when Dr. Venia Minks left. And somehow ended up being seen by Fabio Bering and other providers. Patient would like to keep appt with Dr. B at least for her CPE.

## 2021-02-15 NOTE — Telephone Encounter (Signed)
Just had a CPE with Amanda Walton on 01/27/21. Will not be due for a physical in Feb as scheduled with me. Please reassure her that I will be supervising Amanda Walton and still involved in her care.

## 2021-02-18 ENCOUNTER — Encounter: Payer: Self-pay | Admitting: Family Medicine

## 2021-03-01 DIAGNOSIS — H40153 Residual stage of open-angle glaucoma, bilateral: Secondary | ICD-10-CM | POA: Diagnosis not present

## 2021-04-28 ENCOUNTER — Telehealth: Payer: Self-pay

## 2021-04-28 NOTE — Telephone Encounter (Signed)
Copied from [place] [id number]. Topic: Appointment Scheduling - Scheduling Inquiry for Clinic >> Apr 28, 2021  4:07 PM Tessa Lerner A wrote: Reason for CRM: The patient would like to be contacted to reschedule their AWV   Please contact when available

## 2021-04-29 NOTE — Telephone Encounter (Signed)
Can you reach out to patient to reschedule? Thank you. KW

## 2021-05-13 ENCOUNTER — Ambulatory Visit (INDEPENDENT_AMBULATORY_CARE_PROVIDER_SITE_OTHER): Payer: PPO

## 2021-05-13 DIAGNOSIS — Z0000 Encounter for general adult medical examination without abnormal findings: Secondary | ICD-10-CM | POA: Diagnosis not present

## 2021-05-13 NOTE — Progress Notes (Signed)
Virtual Visit via Telephone Note  I connected with  Amanda Walton on 05/13/21 at  3:40 PM EST by telephone and verified that I am speaking with the correct person using two identifiers.  Location: Patient: home Provider: [facility] Persons participating in the virtual visit: [place]   I discussed the limitations, risks, security and privacy concerns of performing an evaluation and management service by telephone and the availability of in person appointments. The patient expressed understanding and agreed to proceed.  Interactive audio and video telecommunications were attempted between this nurse and patient, however failed, due to patient having technical difficulties OR patient did not have access to video capability.  We continued and completed visit with audio only.  Some vital signs may be absent or patient reported.   Dionisio David, LPN  Subjective:   Amanda Walton is a 76 y.o. female who presents for Medicare Annual (Subsequent) preventive examination.  Review of Systems           Objective:    There were no vitals filed for this visit. There is no height or weight on file to calculate BMI.  Advanced Directives 04/21/2020 03/13/2019 07/04/2017 07/06/2016 03/26/2016 08/04/2015 07/22/2015  Does Patient Have a Medical Advance Directive? Yes Yes Yes Yes Yes Yes Yes  Type of Paramedic of [place];Living will [place];Living will [place];Living will [place];Living will Living will [place];Living will Living will;Healthcare Power of Attorney  Does patient want to make changes to medical advance directive? - - - - - No - Patient declined No - Patient declined  Copy of [place] in Chart? Yes - validated most recent copy scanned in chart (See row information) Yes - validated most recent copy scanned in chart (See row information) Yes - - No -  copy requested No - copy requested    Current Medications (verified) Outpatient Encounter Medications as of 05/13/2021  Medication Sig   alendronate (FOSAMAX) 70 MG tablet TAKE ONE TABLET BY MOUTH EACH WEEK, ON AN EMPTY STOMACH BEFORE BREAKFAST WITH 8oz OF WATER AND REMAIN UPRIGHT FOR :30   CALCIUM CARBONATE-VITAMIN D PO Take 2 tablets by mouth daily.    Cyanocobalamin (VITAMIN B-12) 5000 MCG TBDP Take 1 tablet by mouth daily at 6 (six) AM.   FIBER COMPLETE PO Take 2 capsules by mouth daily.    latanoprost (XALATAN) 0.005 % ophthalmic solution Place 2 drops into both eyes at bedtime.   nitrofurantoin, macrocrystal-monohydrate, (MACROBID) 100 MG capsule Take 1 capsule (100 mg total) by mouth 2 (two) times daily.   rosuvastatin (CRESTOR) 20 MG tablet Take 1 tablet (20 mg total) by mouth daily.   No facility-administered encounter medications on file as of 05/13/2021.    Allergies (verified) Patient has no known allergies.   History: Past Medical History:  Diagnosis Date   Arthritis    Hyperlipidemia    Past Surgical History:  Procedure Laterality Date   TONSILLECTOMY AND ADENOIDECTOMY     TOTAL HIP ARTHROPLASTY Right 08/04/2015   Procedure: RIGHT TOTAL HIP ARTHROPLASTY ANTERIOR APPROACH;  Surgeon: Paralee Cancel, MD;  Location: [facility];  Service: Orthopedics;  Laterality: Right;   TUBAL LIGATION     Family History  Problem Relation Age of Onset   Dementia Mother    Hypertension Father    Heart disease Father    Hyperlipidemia Sister    Pancreatic cancer Sister    Breast cancer Neg Hx  Social History   Socioeconomic History   Marital status: Married    Spouse name: Not on file   Number of children: 2   Years of education: Not on file   Highest education level: Associate degree: occupational, Hotel manager, or vocational program  Occupational History   Occupation: retired    Comment: does some part time work at [employer]  Tobacco Use   Smoking status: Former    Types: Cigarettes     Quit date: 04/10/1989    Years since quitting: 32.1   Smokeless tobacco: Never  Vaping Use   Vaping Use: Never used  Substance and Sexual Activity   Alcohol use: Yes    Alcohol/week: 9.0 standard drinks    Types: 9 Glasses of wine per week    Comment: 1 glass M-F, a couple on the weekend   Drug use: No   Sexual activity: Not on file  Other Topics Concern   Not on file  Social History Narrative   Not on file   Social Determinants of Health   Financial Resource Strain: Not on file  Food Insecurity: Not on file  Transportation Needs: Not on file  Physical Activity: Not on file  Stress: Not on file  Social Connections: Not on file    Tobacco Counseling Counseling given: Not Answered   Clinical Intake:  Pre-visit preparation completed: Yes  Pain : No/denies pain     Nutritional Risks: None Diabetes: No  How often do you need to have someone help you when you read instructions, pamphlets, or other written materials from your doctor or pharmacy?: 1 - Never  Diabetic?no  Interpreter Needed?: No  Information entered by :: Kirke Shaggy, LPN   Activities of Daily Living In your present state of health, do you have any difficulty performing the following activities: 05/09/2021 04/23/2021  Hearing? N N  Vision? N N  Difficulty concentrating or making decisions? N N  Walking or climbing stairs? N N  Dressing or bathing? N N  Doing errands, shopping? N N  Preparing Food and eating ? N N  Using the Toilet? N N  In the past six months, have you accidently leaked urine? N N  Do you have problems with loss of bowel control? N N  Managing your Medications? N N  Managing your Finances? N N  Housekeeping or managing your Housekeeping? N N  Some recent data might be hidden    Patient Care Team: Gwyneth Sprout, FNP as PCP - General (Family Medicine) Rubie Maid, MD as Referring Physician (Obstetrics and Gynecology) Dasher, Rayvon Char, MD as Consulting Physician  (Dermatology) Lorelee Cover., MD as Consulting Physician (Ophthalmology)  Indicate any recent Medical Services you may have received from other than [facility] providers in the past year (date may be approximate).     Assessment:   This is a routine wellness examination for [facility].  Hearing/Vision screen No results found.  Dietary issues and exercise activities discussed:     Goals Addressed   None    Depression Screen PHQ 2/9 Scores 04/21/2020 11/15/2019 03/13/2019 10/10/2018 08/15/2018 08/15/2018 08/15/2018  PHQ - 2 Score 0 0 1 0 0 0 0  PHQ- 9 Score - 0 - 0 - - -    Fall Risk Fall Risk  05/09/2021 04/23/2021 04/23/2021 04/23/2021 04/21/2020  Falls in the past year? 0 0 0 0 0  Number falls in past yr: 0 0 0 0 0  Injury with Fall? 0 0 0 0 0  Follow  up - - - - -    [place]:  Any stairs in or around the home? Yes  If so, are there any without handrails? No  Home free of loose throw rugs in walkways, pet beds, electrical cords, etc? Yes  Adequate lighting in your home to reduce risk of falls? Yes   ASSISTIVE DEVICES UTILIZED TO PREVENT FALLS:  Life alert? No  Use of a cane, walker or w/c? No  Grab bars in the bathroom? Yes  Shower chair or bench in shower? Yes  Elevated toilet seat or a handicapped toilet? Yes   Cognitive Function:Normal cognitive status assessed by direct observation by this Nurse Health Advisor. No abnormalities found.       6CIT Screen 03/13/2019 07/04/2017  What Year? 0 points 0 points  What month? 0 points 0 points  What time? 0 points 0 points  Count back from 20 0 points 0 points  Months in reverse 0 points 0 points  Repeat phrase 0 points 0 points  Total Score 0 0    Immunizations Immunization History  Administered Date(s) Administered   Fluad Quad(high Dose 65+) 01/30/2019, 01/27/2021   Influenza Split 01/16/2009, 01/22/2010, 03/21/2012   Influenza, High Dose Seasonal PF 04/23/2014, 04/20/2017, 02/28/2018,  01/13/2020   Influenza,inj,Quad PF,6+ Mos 03/27/2013   PFIZER(Purple Top)SARS-COV-2 Vaccination 05/25/2019, 06/15/2019, 01/09/2020   Pneumococcal Conjugate-13 04/23/2014   Pneumococcal Polysaccharide-23 03/18/2011   Tdap 07/06/2005   Zoster, Live 01/16/2009    TDAP status: Due, Education has been provided regarding the importance of this vaccine. Advised may receive this vaccine at local pharmacy or Health Dept. Aware to provide a copy of the vaccination record if obtained from local pharmacy or Health Dept. Verbalized acceptance and understanding.  Flu Vaccine status: Up to date  Pneumococcal vaccine status: Up to date  Covid-19 vaccine status: Completed vaccines  Qualifies for Shingles Vaccine? Yes   Zostavax completed Yes   Shingrix Completed?: No.    Education has been provided regarding the importance of this vaccine. Patient has been advised to call insurance company to determine out of pocket expense if they have not yet received this vaccine. Advised may also receive vaccine at local pharmacy or Health Dept. Verbalized acceptance and understanding.  Screening Tests Health Maintenance  Topic Date Due   Zoster Vaccines- Shingrix (1 of 2) Never done   TETANUS/TDAP  07/07/2015   COVID-19 Vaccine (4 - Booster for Pfizer series) 03/05/2020   DEXA SCAN  11/20/2020   COLONOSCOPY (Pts 45-37yrs Insurance coverage will need to be confirmed)  07/23/2024   Pneumonia Vaccine 51+ Years old  Completed   INFLUENZA VACCINE  Completed   Hepatitis C Screening  Completed   HPV VACCINES  Aged Out    Health Maintenance  Health Maintenance Due  Topic Date Due   Zoster Vaccines- Shingrix (1 of 2) Never done   TETANUS/TDAP  07/07/2015   COVID-19 Vaccine (4 - Booster for Pfizer series) 03/05/2020   DEXA SCAN  11/20/2020    Colorectal cancer screening: Type of screening: Colonoscopy. Completed 07/24/14. Repeat every 10 years  Mammogram status: Completed 02/11/21. Repeat every year  Bone  Density status: Completed 11/21/18. Results reflect: Bone density results: NORMAL. Repeat every 5 years.  Lung Cancer Screening: (Low Dose CT Chest recommended if Age 21-80 years, 30 pack-year currently smoking OR have quit w/in 15years.) does not qualify.   Additional Screening:  Hepatitis C Screening: does qualify; Completed 03/21/12  Vision Screening: Recommended annual  ophthalmology exams for early detection of glaucoma and other disorders of the eye. Is the patient up to date with their annual eye exam?  Yes  Who is the provider or what is the name of the office in which the patient attends annual eye exams?  If pt is not established with a provider, would they like to be referred to a provider to establish care? No .   Dental Screening: Recommended annual dental exams for proper oral hygiene  Community Resource Referral / Chronic Care Management: CRR required this visit?  No   CCM required this visit?  No      Plan:     I have personally reviewed and noted the following in the patients chart:   Medical and social history Use of alcohol, tobacco or illicit drugs  Current medications and supplements including opioid prescriptions.  Functional ability and status Nutritional status Physical activity Advanced directives List of other physicians Hospitalizations, surgeries, and ER visits in previous 12 months Vitals Screenings to include cognitive, depression, and falls Referrals and appointments  In addition, I have reviewed and discussed with patient certain preventive protocols, quality metrics, and best practice recommendations. A written personalized care plan for preventive services as well as general preventive health recommendations were provided to patient.     Dionisio David, LPN   10/16/1163   Nurse Notes: none

## 2021-05-13 NOTE — Patient Instructions (Signed)
[name] , Thank you for taking time to come for your Medicare Wellness Visit. I appreciate your ongoing commitment to your health goals. Please review the following plan we discussed and let me know if I can assist you in the future.   Screening recommendations/referrals: Colonoscopy: 07/24/14 Mammogram: 02/11/21 Bone Density: 11/21/18 Recommended yearly ophthalmology/optometry visit for glaucoma screening and checkup Recommended yearly dental visit for hygiene and checkup  Vaccinations: Influenza vaccine: 01/27/21 Pneumococcal vaccine: 04/23/14 Tdap vaccine: 07/06/05 Shingles vaccine: Zostavax 01/16/09   Covid-19:05/25/19, 06/15/19, 01/09/20  Advanced directives: no  Conditions/risks identified: none  Next appointment: Follow up in one year for your annual wellness visit 05/17/22 @ 3pm by phone   Preventive Care 65 Years and Older, Female Preventive care refers to lifestyle choices and visits with your health care provider that can promote health and wellness. What does preventive care include? A yearly physical exam. This is also called an annual well check. Dental exams once or twice a year. Routine eye exams. Ask your health care provider how often you should have your eyes checked. Personal lifestyle choices, including: Daily care of your teeth and gums. Regular physical activity. Eating a healthy diet. Avoiding tobacco and drug use. Limiting alcohol use. Practicing safe sex. Taking low-dose aspirin every day. Taking vitamin and mineral supplements as recommended by your health care provider. What happens during an annual well check? The services and screenings done by your health care provider during your annual well check will depend on your age, overall health, lifestyle risk factors, and family history of disease. Counseling  Your health care provider may ask you questions about your: Alcohol use. Tobacco use. Drug use. Emotional well-being. Home and relationship  well-being. Sexual activity. Eating habits. History of falls. Memory and ability to understand (cognition). Work and work Statistician. Reproductive health. Screening  You may have the following tests or measurements: Height, weight, and BMI. Blood pressure. Lipid and cholesterol levels. These may be checked every 5 years, or more frequently if you are over 65 years old. Skin check. Lung cancer screening. You may have this screening every year starting at age 18 if you have a 30-pack-year history of smoking and currently smoke or have quit within the past 15 years. Fecal occult blood test (FOBT) of the stool. You may have this test every year starting at age 44. Flexible sigmoidoscopy or colonoscopy. You may have a sigmoidoscopy every 5 years or a colonoscopy every 10 years starting at age 44. Hepatitis C blood test. Hepatitis B blood test. Sexually transmitted disease (STD) testing. Diabetes screening. This is done by checking your blood sugar (glucose) after you have not eaten for a while (fasting). You may have this done every 1-3 years. Bone density scan. This is done to screen for osteoporosis. You may have this done starting at age 20. Mammogram. This may be done every 1-2 years. Talk to your health care provider about how often you should have regular mammograms. Talk with your health care provider about your test results, treatment options, and if necessary, the need for more tests. Vaccines  Your health care provider may recommend certain vaccines, such as: Influenza vaccine. This is recommended every year. Tetanus, diphtheria, and acellular pertussis (Tdap, Td) vaccine. You may need a Td booster every 10 years. Zoster vaccine. You may need this after age 58. Pneumococcal 13-valent conjugate (PCV13) vaccine. One dose is recommended after age 29. Pneumococcal polysaccharide (PPSV23) vaccine. One dose is recommended after age 58. Talk to your health care  provider about which  screenings and vaccines you need and how often you need them. This information is not intended to replace advice given to you by your health care provider. Make sure you discuss any questions you have with your health care provider. Document Released: 04/24/2015 Document Revised: 12/16/2015 Document Reviewed: 01/27/2015 Elsevier Interactive Patient Education  2017 [place] Prevention in the Home Falls can cause injuries. They can happen to people of all ages. There are many things you can do to make your home safe and to help prevent falls. What can I do on the outside of my home? Regularly fix the edges of walkways and driveways and fix any cracks. Remove anything that might make you trip as you walk through a door, such as a raised step or threshold. Trim any bushes or trees on the path to your home. Use bright outdoor lighting. Clear any walking paths of anything that might make someone trip, such as rocks or tools. Regularly check to see if handrails are loose or broken. Make sure that both sides of any steps have handrails. Any raised decks and porches should have guardrails on the edges. Have any leaves, snow, or ice cleared regularly. Use sand or salt on walking paths during winter. Clean up any spills in your garage right away. This includes oil or grease spills. What can I do in the bathroom? Use night lights. Install grab bars by the toilet and in the tub and shower. Do not use towel bars as grab bars. Use non-skid mats or decals in the tub or shower. If you need to sit down in the shower, use a plastic, non-slip stool. Keep the floor dry. Clean up any water that spills on the floor as soon as it happens. Remove soap buildup in the tub or shower regularly. Attach bath mats securely with double-sided non-slip rug tape. Do not have throw rugs and other things on the floor that can make you trip. What can I do in the bedroom? Use night lights. Make sure that you have a  light by your bed that is easy to reach. Do not use any sheets or blankets that are too big for your bed. They should not hang down onto the floor. Have a firm chair that has side arms. You can use this for support while you get dressed. Do not have throw rugs and other things on the floor that can make you trip. What can I do in the kitchen? Clean up any spills right away. Avoid walking on wet floors. Keep items that you use a lot in easy-to-reach places. If you need to reach something above you, use a strong step stool that has a grab bar. Keep electrical cords out of the way. Do not use floor polish or wax that makes floors slippery. If you must use wax, use non-skid floor wax. Do not have throw rugs and other things on the floor that can make you trip. What can I do with my stairs? Do not leave any items on the stairs. Make sure that there are handrails on both sides of the stairs and use them. Fix handrails that are broken or loose. Make sure that handrails are as long as the stairways. Check any carpeting to make sure that it is firmly attached to the stairs. Fix any carpet that is loose or worn. Avoid having throw rugs at the top or bottom of the stairs. If you do have throw rugs, attach them to the  floor with carpet tape. Make sure that you have a light switch at the top of the stairs and the bottom of the stairs. If you do not have them, ask someone to add them for you. What else can I do to help prevent falls? Wear shoes that: Do not have high heels. Have rubber bottoms. Are comfortable and fit you well. Are closed at the toe. Do not wear sandals. If you use a stepladder: Make sure that it is fully opened. Do not climb a closed stepladder. Make sure that both sides of the stepladder are locked into place. Ask someone to hold it for you, if possible. Clearly mark and make sure that you can see: Any grab bars or handrails. First and last steps. Where the edge of each step  is. Use tools that help you move around (mobility aids) if they are needed. These include: Canes. Walkers. Scooters. Crutches. Turn on the lights when you go into a dark area. Replace any light bulbs as soon as they burn out. Set up your furniture so you have a clear path. Avoid moving your furniture around. If any of your floors are uneven, fix them. If there are any pets around you, be aware of where they are. Review your medicines with your doctor. Some medicines can make you feel dizzy. This can increase your chance of falling. Ask your doctor what other things that you can do to help prevent falls. This information is not intended to replace advice given to you by your health care provider. Make sure you discuss any questions you have with your health care provider. Document Released: 01/22/2009 Document Revised: 09/03/2015 Document Reviewed: 05/02/2014 Elsevier Interactive Patient Education  2017 [employer].

## 2021-05-17 ENCOUNTER — Ambulatory Visit: Payer: PPO | Admitting: Family Medicine

## 2021-05-24 ENCOUNTER — Ambulatory Visit (INDEPENDENT_AMBULATORY_CARE_PROVIDER_SITE_OTHER): Payer: PPO | Admitting: Family Medicine

## 2021-05-24 ENCOUNTER — Encounter: Payer: Self-pay | Admitting: Family Medicine

## 2021-05-24 ENCOUNTER — Other Ambulatory Visit: Payer: Self-pay

## 2021-05-24 ENCOUNTER — Ambulatory Visit: Payer: PPO | Admitting: Family Medicine

## 2021-05-24 VITALS — BP 119/77 | HR 64 | Temp 97.9°F | Resp 16 | Wt 136.0 lb

## 2021-05-24 DIAGNOSIS — R7989 Other specified abnormal findings of blood chemistry: Secondary | ICD-10-CM | POA: Diagnosis not present

## 2021-05-24 DIAGNOSIS — E7800 Pure hypercholesterolemia, unspecified: Secondary | ICD-10-CM

## 2021-05-24 DIAGNOSIS — M810 Age-related osteoporosis without current pathological fracture: Secondary | ICD-10-CM

## 2021-05-24 NOTE — Assessment & Plan Note (Signed)
Previously elevated, but there was diagnosis of hypothyroidism in her chart She is not hypothyroidism and TSH is normal x years No need to continue checking Not on medicines

## 2021-05-24 NOTE — Progress Notes (Signed)
Established patient visit   Patient: Amanda Walton   DOB: November 04, 1945   76 y.o. Female  MRN: [id number] Visit Date: 05/24/2021  Today's healthcare provider: Lavon Paganini, MD   Chief Complaint  Patient presents with   Hyperlipidemia   I,Amanda Walton,acting as a scribe for Amanda Paganini, MD.,have documented all relevant documentation on the behalf of Amanda Paganini, MD,as directed by  Amanda Paganini, MD while in the presence of Amanda Paganini, MD.  Subjective     Lipid/Cholesterol, Follow-up  Last lipid panel Other pertinent labs  Lab Results  Component Value Date   CHOL 213 (H) 01/27/2021   HDL 59 01/27/2021   LDLCALC 135 (H) 01/27/2021   TRIG 109 01/27/2021   CHOLHDL 3.6 01/27/2021   Lab Results  Component Value Date   ALT 12 11/15/2019   AST 20 11/15/2019   PLT 285 11/15/2019   TSH 3.880 11/15/2019     She was last seen for this 3 months ago.  Management since that visit includes change simvastatin 20 mg daily to crestor 20 mg daily.  She reports excellent compliance with treatment. She is not having side effects.   Symptoms: No chest pain No chest pressure/discomfort  No dyspnea No lower extremity edema  No numbness or tingling of extremity No orthopnea  No palpitations No paroxysmal nocturnal dyspnea  No speech difficulty No syncope   Current diet: in general, a "healthy" diet   Current exercise: bicycling  The 10-year ASCVD risk score (Arnett DK, et al., 2019) is: 14.2%  ---------------------------------------------------------------------------------------------------   Medications: Outpatient Medications Prior to Visit  Medication Sig   alendronate (FOSAMAX) 70 MG tablet TAKE ONE TABLET BY MOUTH EACH WEEK, ON AN EMPTY STOMACH BEFORE BREAKFAST WITH 8oz OF WATER AND REMAIN UPRIGHT FOR :30   CALCIUM CARBONATE-VITAMIN D PO Take 2 tablets by mouth daily.    Cyanocobalamin (VITAMIN B-12) 5000 MCG TBDP Take 1 tablet by mouth  daily at 6 (six) AM.   latanoprost (XALATAN) 0.005 % ophthalmic solution Place 2 drops into both eyes at bedtime.   rosuvastatin (CRESTOR) 20 MG tablet Take 1 tablet (20 mg total) by mouth daily.   [DISCONTINUED] FIBER COMPLETE PO Take 2 capsules by mouth daily.  (Patient not taking: Reported on 05/13/2021)   [DISCONTINUED] nitrofurantoin, macrocrystal-monohydrate, (MACROBID) 100 MG capsule Take 1 capsule (100 mg total) by mouth 2 (two) times daily.   No facility-administered medications prior to visit.    Review of Systems  Constitutional:  Positive for appetite change. Negative for fatigue.  Respiratory:  Negative for chest tightness and shortness of breath.   Cardiovascular:  Negative for chest pain, palpitations and leg swelling.       Objective    BP 119/77 (BP Location: Left Arm, Patient Position: Sitting, Cuff Size: Large)    Pulse 64    Temp 97.9 F (36.6 C) (Temporal)    Resp 16    Wt 136 lb (61.7 kg)    BMI 24.09 kg/m  BP Readings from Last 3 Encounters:  05/24/21 119/77  01/27/21 (!) 145/70  11/15/19 121/69   Wt Readings from Last 3 Encounters:  05/24/21 136 lb (61.7 kg)  01/27/21 134 lb (60.8 kg)  11/15/19 136 lb (61.7 kg)      Physical Exam Vitals reviewed.  Constitutional:      General: She is not in acute distress.    Appearance: Normal appearance. She is well-developed. She is not diaphoretic.  HENT:     Head:  Normocephalic and atraumatic.  Eyes:     General: No scleral icterus.    Conjunctiva/sclera: Conjunctivae normal.  Neck:     Thyroid: No thyromegaly.  Cardiovascular:     Rate and Rhythm: Normal rate and regular rhythm.     Pulses: Normal pulses.     Heart sounds: Normal heart sounds. No murmur heard. Pulmonary:     Effort: Pulmonary effort is normal. No respiratory distress.     Breath sounds: Normal breath sounds. No wheezing, rhonchi or rales.  Musculoskeletal:     Cervical back: Neck supple.     Right lower leg: No edema.     Left lower  leg: No edema.  Lymphadenopathy:     Cervical: No cervical adenopathy.  Skin:    General: Skin is warm and dry.     Findings: No rash.  Neurological:     Mental Status: She is alert and oriented to person, place, and time. Mental status is at baseline.  Psychiatric:        Mood and Affect: Mood normal.        Behavior: Behavior normal.      No results found for any visits on 05/24/21.  Assessment & Plan     Problem List Items Addressed This Visit       Musculoskeletal and Integument   OP (osteoporosis)    On fosmax for about 2.5 years (actually taking it) Will continue for total of 5 years Repeat DEXA      Relevant Orders   DG Bone Density     Other   Hypercholesteremia - Primary    Previously with elevated LDL Doing well with change from simvastatin to Crestor Do wonder if Crestor 20 is slightly elevated - but tolerating well Will re-eval after lipid results.      Relevant Orders   Lipid panel   Comprehensive metabolic panel   Elevated TSH    Previously elevated, but there was diagnosis of hypothyroidism in her chart She is not hypothyroidism and TSH is normal x years No need to continue checking Not on medicines        Return in about 9 months (around 02/21/2022) for CPE.      I, Amanda Paganini, MD, have reviewed all documentation for this visit. The documentation on 05/24/21 for the exam, diagnosis, procedures, and orders are all accurate and complete.   Amanda Walton, Amanda Bucy, MD, MPH [facility]

## 2021-05-24 NOTE — Assessment & Plan Note (Signed)
Previously with elevated LDL Doing well with change from simvastatin to Crestor Do wonder if Crestor 20 is slightly elevated - but tolerating well Will re-eval after lipid results.

## 2021-05-24 NOTE — Assessment & Plan Note (Signed)
On fosmax for about 2.5 years (actually taking it) Will continue for total of 5 years Repeat DEXA

## 2021-05-25 LAB — COMPREHENSIVE METABOLIC PANEL
ALT: 15 IU/L (ref 0–32)
AST: 20 IU/L (ref 0–40)
Albumin/Globulin Ratio: 1.7 (ref 1.2–2.2)
Albumin: 4.6 g/dL (ref 3.7–4.7)
Alkaline Phosphatase: 72 IU/L (ref 44–121)
BUN/Creatinine Ratio: 32 — ABNORMAL HIGH (ref 12–28)
BUN: 20 mg/dL (ref 8–27)
Bilirubin Total: 0.4 mg/dL (ref 0.0–1.2)
CO2: 26 mmol/L (ref 20–29)
Calcium: 9.5 mg/dL (ref 8.7–10.3)
Chloride: 104 mmol/L (ref 96–106)
Creatinine, Ser: 0.63 mg/dL (ref 0.57–1.00)
Globulin, Total: 2.7 g/dL (ref 1.5–4.5)
Glucose: 91 mg/dL (ref 70–99)
Potassium: 4.9 mmol/L (ref 3.5–5.2)
Sodium: 141 mmol/L (ref 134–144)
Total Protein: 7.3 g/dL (ref 6.0–8.5)
eGFR: 92 mL/min/{1.73_m2} (ref >59)

## 2021-05-25 LAB — LIPID PANEL
Chol/HDL Ratio: 2.8 ratio (ref 0.0–4.4)
Cholesterol, Total: 163 mg/dL (ref 100–199)
HDL: 58 mg/dL (ref >39)
LDL Chol Calc (NIH): 87 mg/dL (ref 0–99)
Triglycerides: 97 mg/dL (ref 0–149)
VLDL Cholesterol Cal: 18 mg/dL (ref 5–40)

## 2021-06-07 DIAGNOSIS — L218 Other seborrheic dermatitis: Secondary | ICD-10-CM | POA: Diagnosis not present

## 2021-06-07 DIAGNOSIS — L821 Other seborrheic keratosis: Secondary | ICD-10-CM | POA: Diagnosis not present

## 2021-07-13 ENCOUNTER — Other Ambulatory Visit: Payer: PPO

## 2021-07-13 ENCOUNTER — Ambulatory Visit
Admission: RE | Admit: 2021-07-13 | Discharge: 2021-07-13 | Disposition: A | Discharge status: Home / Self Care | Payer: PPO | Source: Ambulatory Visit | Attending: Family Medicine | Admitting: Family Medicine

## 2021-07-13 DIAGNOSIS — M810 Age-related osteoporosis without current pathological fracture: Secondary | ICD-10-CM | POA: Insufficient documentation

## 2021-07-13 DIAGNOSIS — Z780 Asymptomatic menopausal state: Secondary | ICD-10-CM | POA: Diagnosis not present

## 2021-07-13 DIAGNOSIS — M85852 Other specified disorders of bone density and structure, left thigh: Secondary | ICD-10-CM | POA: Diagnosis not present

## 2021-07-15 DIAGNOSIS — M546 Pain in thoracic spine: Secondary | ICD-10-CM | POA: Diagnosis not present

## 2021-07-15 DIAGNOSIS — S2020XA Contusion of thorax, unspecified, initial encounter: Secondary | ICD-10-CM | POA: Diagnosis not present

## 2021-07-19 ENCOUNTER — Ambulatory Visit: Payer: Self-pay

## 2021-07-19 ENCOUNTER — Telehealth: Payer: Self-pay

## 2021-07-19 DIAGNOSIS — R1032 Left lower quadrant pain: Secondary | ICD-10-CM | POA: Diagnosis not present

## 2021-07-19 DIAGNOSIS — M545 Low back pain, unspecified: Secondary | ICD-10-CM | POA: Diagnosis not present

## 2021-07-19 DIAGNOSIS — Z0389 Encounter for observation for other suspected diseases and conditions ruled out: Secondary | ICD-10-CM | POA: Diagnosis not present

## 2021-07-19 DIAGNOSIS — N10 Acute tubulo-interstitial nephritis: Secondary | ICD-10-CM | POA: Diagnosis not present

## 2021-07-19 DIAGNOSIS — R109 Unspecified abdominal pain: Secondary | ICD-10-CM | POA: Diagnosis not present

## 2021-07-19 NOTE — Telephone Encounter (Signed)
Pt stated she is going to go to Amanda Walton and no longer needs orders sent to [facility] imaging.  ?

## 2021-07-19 NOTE — Addendum Note (Signed)
Addended by: Virginia Crews on: 07/19/2021 02:26 PM ? ? Modules accepted: Orders ? ?

## 2021-07-19 NOTE — Telephone Encounter (Signed)
Noted. Order cancelled

## 2021-07-19 NOTE — Telephone Encounter (Signed)
Reason for Disposition ?? [1] MILD pain AND [2] present > 7 days ? ?Answer Assessment - Initial Assessment Questions ?1. MECHANISM: "How did the injury happen?" ?    Working with a friendly large dog ?2. ONSET: "When did the injury happen?" (e.g., minutes, hours, days ago) ?    A week Saturday - was working with a dog ?3. LOCATION: "Where on the chest is the injury located?" "Where does it hurt?" ?    Left side back  - next to spine ?4. CHEST OR RIB PAIN SEVERITY: "How bad is the pain?"  (e.g., Scale 1-10; mild, moderate, or severe) ?   - MILD (1-3): doesn't interfere with normal activities  ?   - MODERATE (4-7): interferes with normal activities or awakens from sleep ?   - SEVERE (8-10): excruciating pain, unable to do any normal activities   ?    8/10 ?5. BREATHING DIFFICULTY: "Are you having any difficulty breathing?" If Yes, ask: "How bad is it?"  (e.g., none, mild, moderate, severe)  ?6: OTHER SYMPTOMS (e.g., cough, fever, rash)  ?    none ?7. PREGNANCY: "Is there any chance you are pregnant?" "When was your last menstrual period?" ?    na ? ?Protocols used: Chest Injury - Bending, Lifting, or Twisting-A-AH ? ?

## 2021-07-19 NOTE — Telephone Encounter (Signed)
Ok to order L rib films at [facility].  If she is having any difficulty breathing, she needs to stay and be evaluated there.  If she needs medicaitons, she will need to be seen.

## 2021-07-19 NOTE — Telephone Encounter (Signed)
See other phone note

## 2021-07-19 NOTE — Telephone Encounter (Signed)
Copied from [place]. Topic: Appointment Scheduling - Scheduling Inquiry for Clinic ?>> Jul 19, 2021 10:20 AM Erick Blinks wrote: ?Reason for CRM: Pt would like to be scheduled for an Xray today, she does not want to go to [facility]. Pt says she believes she has bruised ribs.  ?Best contact: [phone] ? ?Transferred to triage ?

## 2021-07-19 NOTE — Telephone Encounter (Signed)
?  Chief Complaint: Left side back pain next to spine ?Symptoms: pain - bruise ?Frequency: Since last Saturday 07/10/2021 ?Pertinent Negatives: Patient denies CHEST PAIN sob ?Disposition: '[]'$ ED /'[x]'$ Urgent Care (no appt availability in office) / '[]'$ Appointment(In office/virtual)/ '[]'$  [place] [facility]/ '[]'$ Home Care/ '[]'$ Refused Recommended Disposition /'[]'$ [place] [facility]/ '[]'$  Follow-up with PCP ?Additional Notes:Pt was seen for this Friday at [facility] [place]. They saw a bruise, but did not do an xray as there was no one to read the xray. Pt is now back at [facility] for Xray, but was hoping to have the office schedule an appointment for an xray. Wait time at [facility] is several hours.  Pt does not wish to be seen in office. Please advise. ?

## 2021-07-19 NOTE — Addendum Note (Signed)
Addended by: Shawna Orleans on: 07/19/2021 11:26 AM ? ? Modules accepted: Orders ? ?

## 2021-07-22 ENCOUNTER — Encounter: Payer: Self-pay | Admitting: Family Medicine

## 2021-07-27 DIAGNOSIS — H40153 Residual stage of open-angle glaucoma, bilateral: Secondary | ICD-10-CM | POA: Diagnosis not present

## 2021-07-30 ENCOUNTER — Encounter: Payer: Self-pay | Admitting: Physician Assistant

## 2021-07-30 ENCOUNTER — Ambulatory Visit (INDEPENDENT_AMBULATORY_CARE_PROVIDER_SITE_OTHER): Payer: PPO | Admitting: Physician Assistant

## 2021-07-30 VITALS — BP 118/69 | HR 70 | Temp 98.1°F | Resp 16 | Wt 137.0 lb

## 2021-07-30 DIAGNOSIS — S29012A Strain of muscle and tendon of back wall of thorax, initial encounter: Secondary | ICD-10-CM | POA: Diagnosis not present

## 2021-07-30 DIAGNOSIS — R319 Hematuria, unspecified: Secondary | ICD-10-CM

## 2021-07-30 DIAGNOSIS — Z87448 Personal history of other diseases of urinary system: Secondary | ICD-10-CM

## 2021-07-30 LAB — POCT URINALYSIS DIPSTICK
Bilirubin, UA: NEGATIVE
Blood, UA: POSITIVE
Glucose, UA: NEGATIVE
Ketones, UA: NEGATIVE
Leukocytes, UA: NEGATIVE
Nitrite, UA: NEGATIVE
Protein, UA: NEGATIVE
Spec Grav, UA: 1.025 (ref 1.010–1.025)
Urobilinogen, UA: 0.2 E.U./dL
pH, UA: 6 (ref 5.0–8.0)

## 2021-07-30 NOTE — Progress Notes (Signed)
?  ? ? ?Established patient visit ? ?I,[name],acting as a Education administrator for [employer], PA-C.,have documented all relevant documentation on the behalf of Amanda Kirschner, PA-C,as directed by  Amanda Kirschner, PA-C while in the presence of Amanda Kirschner, PA-C.  ? ?Patient: Amanda Walton   DOB: 1946-03-08   76 y.o. Female  MRN: [id number] ?Visit Date: 07/30/2021 ? ?Today's healthcare provider: [name], PA-C  ? ?Chief Complaint  ?Patient presents with  ? Back Pain  ? ?Subjective  ?  ?HPI  ?Amanda Walton is a 76 y/o female who presents today with mid back pain x 2 weeks. She felt severe mid back pain after her dog pulled her suddenly and when it didn't get better, went to an urgent care. Xrays were done, no acute abnormalities. She went back to another urgent care as the pain still didn't improve, was told she has 'a kidney infection' and was treated with antibiotics-omnicef and meloxicam. She completed the antibiotics. ? ?Reports her pain is much improved but wants to make sure she is cleared of the infection.  ? ?Medications: ?Outpatient Medications Prior to Visit  ?Medication Sig  ? alendronate (FOSAMAX) 70 MG tablet TAKE ONE TABLET BY MOUTH EACH WEEK, ON AN EMPTY STOMACH BEFORE BREAKFAST WITH 8oz OF WATER AND REMAIN UPRIGHT FOR :30  ? CALCIUM CARBONATE-VITAMIN D PO Take 2 tablets by mouth daily.   ? Cyanocobalamin (VITAMIN B-12) 5000 MCG TBDP Take 1 tablet by mouth daily at 6 (six) AM.  ? latanoprost (XALATAN) 0.005 % ophthalmic solution Place 2 drops into both eyes at bedtime.  ? meloxicam (MOBIC) 15 MG tablet Take 15 mg by mouth daily.  ? rosuvastatin (CRESTOR) 20 MG tablet Take 1 tablet (20 mg total) by mouth daily.  ? cefdinir (OMNICEF) 300 MG capsule Take 300 mg by mouth 2 (two) times daily. (Patient not taking: Reported on 07/30/2021)  ? traMADol (ULTRAM) 50 MG tablet Take by mouth every 6 (six) hours as needed. (Patient not taking: Reported on 07/30/2021)  ? ?No facility-administered medications prior to  visit.  ? ? ?Review of Systems  ?Constitutional:  Negative for fatigue and fever.  ?Respiratory:  Negative for cough and shortness of breath.   ?Cardiovascular:  Negative for chest pain and leg swelling.  ?Gastrointestinal:  Negative for abdominal pain.  ?Musculoskeletal:  Positive for back pain.  ?Neurological:  Negative for dizziness and headaches.  ? ? ?  Objective  ?  ?BP 118/69 (BP Location: Right Arm, Patient Position: Sitting, Cuff Size: Normal)   Pulse 70   Temp 98.1 ?F (36.7 ?C) (Temporal)   Resp 16   Wt 137 lb (62.1 kg)   SpO2 95%   BMI 24.27 kg/m?  ?{ ?Physical Exam ?Vitals reviewed.  ?Constitutional:   ?   Appearance: She is not ill-appearing.  ?HENT:  ?   Head: Normocephalic.  ?Eyes:  ?   Conjunctiva/sclera: Conjunctivae normal.  ?Cardiovascular:  ?   Rate and Rhythm: Normal rate.  ?Pulmonary:  ?   Effort: Pulmonary effort is normal. No respiratory distress.  ?Musculoskeletal:  ?   Comments: No tenderness to L midback area of initial pain. Full ROM. No overlying rash, bruise, or erythema.  ?Neurological:  ?   General: No focal deficit present.  ?   Mental Status: She is alert and oriented to person, place, and time.  ?Psychiatric:     ?   Mood and Affect: Mood normal.     ?   Behavior: Behavior normal.  ?  ? ? ?  No results found for any visits on 07/30/21. ? Assessment & Plan  ?  ? ?Mid back strain 2. Recent pyleonephritis ?UA today - leuk, + blood ?Will send for culture to ensure infection cleared ?Continue mobic prn, stretches.  ? ?I, Amanda Kirschner, PA-C have reviewed all documentation for this visit. The documentation on  07/30/2021 for the exam, diagnosis, procedures, and orders are all accurate and complete. ? ?Amanda Kirschner, PA-C ?[place] ?[id number] ?[place], [place], [address] ?Office: [phone] ?Fax: [phone]  ? ?[facility] ?

## 2021-08-02 ENCOUNTER — Encounter: Payer: Self-pay | Admitting: Physician Assistant

## 2021-08-03 ENCOUNTER — Telehealth: Payer: Self-pay

## 2021-08-03 LAB — URINALYSIS, MICROSCOPIC ONLY

## 2021-08-03 LAB — URINE CULTURE

## 2021-08-03 LAB — SPECIMEN STATUS REPORT

## 2021-08-04 ENCOUNTER — Other Ambulatory Visit: Payer: Self-pay | Admitting: Physician Assistant

## 2021-08-04 DIAGNOSIS — N12 Tubulo-interstitial nephritis, not specified as acute or chronic: Secondary | ICD-10-CM

## 2021-08-04 MED ORDER — SULFAMETHOXAZOLE-TRIMETHOPRIM 800-160 MG PO TABS: 1.0000 | ORAL_TABLET | Freq: Two times a day (BID) | ORAL | 0 refills | Status: AC

## 2021-08-08 ENCOUNTER — Encounter: Payer: Self-pay | Admitting: Physician Assistant

## 2021-08-09 ENCOUNTER — Ambulatory Visit: Payer: Self-pay

## 2021-08-09 ENCOUNTER — Encounter: Payer: Self-pay | Admitting: Family Medicine

## 2021-08-09 ENCOUNTER — Ambulatory Visit (INDEPENDENT_AMBULATORY_CARE_PROVIDER_SITE_OTHER): Payer: PPO | Admitting: Family Medicine

## 2021-08-09 VITALS — BP 127/53 | HR 94 | Temp 98.4°F | Resp 16 | Wt 134.0 lb

## 2021-08-09 DIAGNOSIS — R319 Hematuria, unspecified: Secondary | ICD-10-CM

## 2021-08-09 DIAGNOSIS — R252 Cramp and spasm: Secondary | ICD-10-CM

## 2021-08-09 DIAGNOSIS — T50905A Adverse effect of unspecified drugs, medicaments and biological substances, initial encounter: Secondary | ICD-10-CM | POA: Diagnosis not present

## 2021-08-09 DIAGNOSIS — N12 Tubulo-interstitial nephritis, not specified as acute or chronic: Secondary | ICD-10-CM

## 2021-08-09 LAB — POCT URINALYSIS DIPSTICK
Glucose, UA: NEGATIVE
Leukocytes, UA: NEGATIVE
Nitrite, UA: NEGATIVE
Protein, UA: POSITIVE — AB
Spec Grav, UA: >=1.03 — AB (ref 1.010–1.025)
Urobilinogen, UA: 0.2 E.U./dL
pH, UA: 6 (ref 5.0–8.0)

## 2021-08-09 NOTE — Telephone Encounter (Signed)
? ? ?  Chief Complaint: Possible reaction to Bactrim ?Symptoms: Headache, chills, bilateral calf pain ?Frequency: Started Friday after starting Bactrim ?Pertinent Negatives: Patient denies any other symptoms ?Disposition: '[]'$ ED /'[]'$ Urgent Care (no appt availability in office) / '[x]'$ Appointment(In office/virtual)/ '[]'$  [place] [facility]/ '[]'$ Home Care/ '[]'$ Refused Recommended Disposition /'[]'$ [place] [facility]/ '[]'$  Follow-up with PCP ?Additional Notes: Pt. Started having symptoms after starting Bactrim.  ?Reason for Disposition ? [1] Caller has URGENT medicine question about med that PCP or specialist prescribed AND [2] triager unable to answer question ? ?Answer Assessment - Initial Assessment Questions ?1. NAME of MEDICATION: "What medicine are you calling about?" ?    Bactrim ?2. QUESTION: "What is your question?" (e.g., double dose of medicine, side effect) ?    Headache, chills, bilateral calf pain ?3. PRESCRIBING HCP: "Who prescribed it?" Reason: if prescribed by specialist, call should be referred to that group. ?    [facility] ?4. SYMPTOMS: "Do you have any symptoms?" ?    Yes ?5. SEVERITY: If symptoms are present, ask "Are they mild, moderate or severe?" ?    Severe ?6. PREGNANCY:  "Is there any chance that you are pregnant?" "When was your last menstrual period?" ?    No ? ?Protocols used: Medication Question Call-A-AH ? ?

## 2021-08-09 NOTE — Progress Notes (Signed)
?  ? ?I,Roshena L Chambers,acting as a scribe for Lelon Huh, MD.,have documented all relevant documentation on the behalf of Lelon Huh, MD,as directed by  Lelon Huh, MD while in the presence of Lelon Huh, MD.  ? ? ?Established patient visit ? ? ?Patient: Amanda Walton   DOB: 1946-03-04   76 y.o. Female  MRN: [id number] ?Visit Date: 08/09/2021 ? ?Today's healthcare provider: Lelon Huh, MD  ? ?Chief Complaint  ?Patient presents with  ? Follow-up  ? ?Subjective  ?  ?HPI  ? Patient was last seen in the office on 07/30/2021 for symptoms of hematuria and pyelonephritis. She was prescribed bactrim. Patient reports having symptoms of fever (up to 101.7), chills, headaches, body aches, and calf pain 2 days after taking Bactrim . She is concerned that symptoms are related to side effects from Bactrim. ?Flank pain and fevers have all resolved, and leg pains are a little better today. Mainly hurts when standing, but eases up when she walks.  ? ?Medications: ?Outpatient Medications Prior to Visit  ?Medication Sig  ? alendronate (FOSAMAX) 70 MG tablet TAKE ONE TABLET BY MOUTH EACH WEEK, ON AN EMPTY STOMACH BEFORE BREAKFAST WITH 8oz OF WATER AND REMAIN UPRIGHT FOR :30  ? CALCIUM CARBONATE-VITAMIN D PO Take 2 tablets by mouth daily.   ? Cyanocobalamin (VITAMIN B-12) 5000 MCG TBDP Take 1 tablet by mouth daily at 6 (six) AM.  ? latanoprost (XALATAN) 0.005 % ophthalmic solution Place 2 drops into both eyes at bedtime.  ? meloxicam (MOBIC) 15 MG tablet Take 15 mg by mouth daily.  ? rosuvastatin (CRESTOR) 20 MG tablet Take 1 tablet (20 mg total) by mouth daily.  ? sulfamethoxazole-trimethoprim (BACTRIM DS) 800-160 MG tablet Take 1 tablet by mouth 2 (two) times daily for 5 days.  ? ?No facility-administered medications prior to visit.  ? ? ?Review of Systems  ?Constitutional:  Positive for chills, fatigue and fever. Negative for appetite change.  ?Respiratory:  Negative for chest tightness and shortness of breath.    ?Cardiovascular:  Negative for chest pain and palpitations.  ?Gastrointestinal:  Negative for abdominal pain, nausea and vomiting.  ?Musculoskeletal:  Positive for myalgias.  ?Neurological:  Positive for headaches. Negative for dizziness and weakness.  ? ? ?  Objective  ?  ?BP (!) 127/53 (BP Location: Left Arm, Patient Position: Sitting, Cuff Size: Normal)   Pulse 94   Temp 98.4 ?F (36.9 ?C) (Oral)   Resp 16   Wt 134 lb (60.8 kg)   SpO2 96% Comment: room air  BMI 23.74 kg/m?  ? ? ?Physical Exam  ? ?General appearance: Well developed, well nourished female, cooperative and in no acute distress ?Head: Normocephalic, without obvious abnormality, atraumatic ?Respiratory: Respirations even and unlabored, normal respiratory rate ?Extremities: All extremities are intact. No calf tenderness swelling or erythema. Marland Kitchen  ?Skin: Skin color, texture, turgor normal. No rashes seen  ?Psych: Appropriate mood and affect. ?Neurologic: Mental status: Alert, oriented to person, place, and time, thought content appropriate. ? ?Results for orders placed or performed in visit on 08/09/21  ?POCT Urinalysis Dipstick  ?Result Value Ref Range  ? Color, UA amber   ? Clarity, UA clear   ? Glucose, UA Negative Negative  ? Bilirubin, UA small   ? Ketones, UA trace   ? Spec Grav, UA >=1.030 (A) 1.010 - 1.025  ? Blood, UA Large (hemolyzed)   ? pH, UA 6.0 5.0 - 8.0  ? Protein, UA Positive (A) Negative  ? Urobilinogen, UA 0.2 0.2  or 1.0 E.U./dL  ? Nitrite, UA negative   ? Leukocytes, UA Negative Negative  ? Appearance    ? Odor    ?  ? Assessment & Plan  ?  ? ?1. Pyelonephritis ?Much improved symptomatically, still blood on u/a ?- Urine Culture ? ?2. Hematuria, unspecified type ?Recheck culture ? ?3. Leg cramp ?Multifactorial. Encouraged to push fluids. Put rosuvastatin on hold for a week or two. Has one day left of Bactrim for finish.  ? ?Addendum 08/13/2021. Patient sent My chart message on 08-12-2021 that she has now developed a rash  on legs arms  and chest which she attributes to Bactrim ?   ? ? ?The entirety of the information documented in the History of Present Illness, Review of Systems and Physical Exam were personally obtained by me. Portions of this information were initially documented by the CMA and reviewed by me for thoroughness and accuracy.   ? ? ?Lelon Huh, MD  ?[facility] ?[phone] (phone) ?[phone] (fax) ? ?[facility]  ?

## 2021-08-11 ENCOUNTER — Encounter: Payer: Self-pay | Admitting: Family Medicine

## 2021-08-11 LAB — URINE CULTURE: Organism ID, Bacteria: NO GROWTH

## 2021-08-12 ENCOUNTER — Encounter: Payer: Self-pay | Admitting: Family Medicine

## 2021-09-23 DIAGNOSIS — H2511 Age-related nuclear cataract, right eye: Secondary | ICD-10-CM | POA: Diagnosis not present

## 2021-09-23 DIAGNOSIS — H2513 Age-related nuclear cataract, bilateral: Secondary | ICD-10-CM | POA: Diagnosis not present

## 2021-09-23 DIAGNOSIS — H401131 Primary open-angle glaucoma, bilateral, mild stage: Secondary | ICD-10-CM | POA: Diagnosis not present

## 2021-09-23 DIAGNOSIS — H25043 Posterior subcapsular polar age-related cataract, bilateral: Secondary | ICD-10-CM | POA: Diagnosis not present

## 2021-09-23 DIAGNOSIS — H18413 Arcus senilis, bilateral: Secondary | ICD-10-CM | POA: Diagnosis not present

## 2021-10-06 DIAGNOSIS — B009 Herpesviral infection, unspecified: Secondary | ICD-10-CM | POA: Diagnosis not present

## 2021-10-06 DIAGNOSIS — L578 Other skin changes due to chronic exposure to nonionizing radiation: Secondary | ICD-10-CM | POA: Diagnosis not present

## 2021-10-06 DIAGNOSIS — Z86018 Personal history of other benign neoplasm: Secondary | ICD-10-CM | POA: Diagnosis not present

## 2021-10-28 ENCOUNTER — Telehealth: Payer: Self-pay | Admitting: Family Medicine

## 2021-10-28 NOTE — Telephone Encounter (Signed)
[facility] faxed refill request for the following medications:  alendronate (FOSAMAX) 70 MG tablet   Please advise.

## 2021-10-29 ENCOUNTER — Other Ambulatory Visit: Payer: Self-pay

## 2021-10-29 DIAGNOSIS — M8580 Other specified disorders of bone density and structure, unspecified site: Secondary | ICD-10-CM

## 2021-10-29 DIAGNOSIS — Z9189 Other specified personal risk factors, not elsewhere classified: Secondary | ICD-10-CM

## 2021-10-29 MED ORDER — ALENDRONATE SODIUM 70 MG PO TABS: ORAL_TABLET | ORAL | 3 refills | Status: DC

## 2021-11-16 ENCOUNTER — Other Ambulatory Visit: Payer: Self-pay | Admitting: Family Medicine

## 2021-11-16 DIAGNOSIS — E7800 Pure hypercholesterolemia, unspecified: Secondary | ICD-10-CM

## 2021-12-22 DIAGNOSIS — H2511 Age-related nuclear cataract, right eye: Secondary | ICD-10-CM | POA: Diagnosis not present

## 2021-12-23 DIAGNOSIS — H2512 Age-related nuclear cataract, left eye: Secondary | ICD-10-CM | POA: Diagnosis not present

## 2022-01-05 DIAGNOSIS — H2512 Age-related nuclear cataract, left eye: Secondary | ICD-10-CM | POA: Diagnosis not present

## 2022-01-28 ENCOUNTER — Encounter: Payer: Self-pay | Admitting: Family Medicine

## 2022-01-28 ENCOUNTER — Ambulatory Visit (INDEPENDENT_AMBULATORY_CARE_PROVIDER_SITE_OTHER): Payer: PPO | Admitting: Family Medicine

## 2022-01-28 VITALS — BP 127/75 | HR 66 | Temp 98.4°F | Resp 16 | Ht 63.0 in | Wt 134.9 lb

## 2022-01-28 DIAGNOSIS — R7989 Other specified abnormal findings of blood chemistry: Secondary | ICD-10-CM | POA: Diagnosis not present

## 2022-01-28 DIAGNOSIS — E7800 Pure hypercholesterolemia, unspecified: Secondary | ICD-10-CM

## 2022-01-28 DIAGNOSIS — Z0000 Encounter for general adult medical examination without abnormal findings: Secondary | ICD-10-CM | POA: Diagnosis not present

## 2022-01-28 DIAGNOSIS — Z1231 Encounter for screening mammogram for malignant neoplasm of breast: Secondary | ICD-10-CM | POA: Diagnosis not present

## 2022-01-28 DIAGNOSIS — Z23 Encounter for immunization: Secondary | ICD-10-CM | POA: Diagnosis not present

## 2022-01-28 DIAGNOSIS — M810 Age-related osteoporosis without current pathological fracture: Secondary | ICD-10-CM | POA: Diagnosis not present

## 2022-01-28 NOTE — Assessment & Plan Note (Signed)
Previously well controlled Continue statin Repeat FLP and CMP  

## 2022-01-28 NOTE — Assessment & Plan Note (Signed)
Previously elevated, but there was a diagnosis of hypothyroidism in chart She does not have hypo or hyperthyroidism and her TSH has been normal for many years Can continue to monitor annually, but no need for medications

## 2022-01-28 NOTE — Assessment & Plan Note (Addendum)
On fosamax for about 3 yrs (of actually taking it) Plan for drug holiday at 5 years Reviewed last DEXA

## 2022-01-28 NOTE — Progress Notes (Signed)
I,Sulibeya S Dimas,acting as a Education administrator for Lavon Paganini, MD.,have documented all relevant documentation on the behalf of Lavon Paganini, MD,as directed by  Lavon Paganini, MD while in the presence of Lavon Paganini, MD.    Complete physical exam   Patient: Amanda Walton   DOB: 09-22-1945   76 y.o. Female  MRN: [id number] Visit Date: 01/28/2022  Today's healthcare provider: Lavon Paganini, MD   Chief Complaint  Patient presents with   Annual Exam   Subjective    Amanda Walton is a 76 y.o. female who presents today for a complete physical exam.  She reports consuming a general diet. Home exercise routine includes bike. She generally feels fairly well. She reports sleeping fairly well. She does not have additional problems to discuss today.  HPI  05/13/21 AWV  Past Medical History:  Diagnosis Date   Arthritis    Hyperlipidemia    Past Surgical History:  Procedure Laterality Date   TONSILLECTOMY AND ADENOIDECTOMY     TOTAL HIP ARTHROPLASTY Right 08/04/2015   Procedure: RIGHT TOTAL HIP ARTHROPLASTY ANTERIOR APPROACH;  Surgeon: Paralee Cancel, MD;  Location: [facility];  Service: Orthopedics;  Laterality: Right;   TUBAL LIGATION     Social History   Socioeconomic History   Marital status: Married    Spouse name: Not on file   Number of children: 2   Years of education: Not on file   Highest education level: Associate degree: occupational, Hotel manager, or vocational program  Occupational History   Occupation: retired    Comment: does some part time work at [employer]  Tobacco Use   Smoking status: Former    Types: Cigarettes    Quit date: 04/10/1989    Years since quitting: 32.8   Smokeless tobacco: Never  Vaping Use   Vaping Use: Never used  Substance and Sexual Activity   Alcohol use: Yes    Alcohol/week: 9.0 standard drinks of alcohol    Types: 9 Glasses of wine per week    Comment: 1 glass M-F, a couple on the weekend   Drug use: No   Sexual  activity: Not on file  Other Topics Concern   Not on file  Social History Narrative   Not on file   Social Determinants of Health   Financial Resource Strain: Low Risk  (05/13/2021)   Overall Financial Resource Strain (CARDIA)    Difficulty of Paying Living Expenses: Not hard at all  Food Insecurity: No Food Insecurity (05/13/2021)   Hunger Vital Sign    Worried About Running Out of Food in the Last Year: Never true    Ran Out of Food in the Last Year: Never true  Transportation Needs: No Transportation Needs (05/13/2021)   PRAPARE - Hydrologist (Medical): No    Lack of Transportation (Non-Medical): No  Physical Activity: Insufficiently Active (05/13/2021)   Exercise Vital Sign    Days of Exercise per Week: 7 days    Minutes of Exercise per Session: 20 min  Stress: No Stress Concern Present (05/13/2021)   [place]    Feeling of Stress : Not at all  Social Connections: Moderately Integrated (05/13/2021)   Social Connection and Isolation Panel [NHANES]    Frequency of Communication with Friends and Family: More than three times a week    Frequency of Social Gatherings with Friends and Family: More than three times a week    Attends Religious Services: More than 4  times per year    Active Member of Clubs or Organizations: No    Attends Archivist Meetings: Never    Marital Status: Married  Human resources officer Violence: Not At Risk (05/13/2021)   Humiliation, Afraid, Rape, and Kick questionnaire    Fear of Current or Ex-Partner: No    Emotionally Abused: No    Physically Abused: No    Sexually Abused: No   Family Status  Relation Name Status   Mother  Deceased at age 33   Father  56   Sister  39   Sister  Deceased at age 88   Neg Hx  (Not Specified)   Family History  Problem Relation Age of Onset   Dementia Mother    Hypertension Father    Heart disease Father     Hyperlipidemia Sister    Pancreatic cancer Sister    Breast cancer Neg Hx    Allergies  Allergen Reactions   Bactrim [Sulfamethoxazole-Trimethoprim] Rash    Headaches, muscle aches    Patient Care Team: Vernita Tague, Dionne Bucy, MD as PCP - General (Family Medicine) Rubie Maid, MD as Referring Physician (Obstetrics and Gynecology) Dasher, Rayvon Char, MD as Consulting Physician (Dermatology) Lorelee Cover., MD as Consulting Physician (Ophthalmology)   Medications: Outpatient Medications Prior to Visit  Medication Sig   alendronate (FOSAMAX) 70 MG tablet TAKE ONE TABLET BY MOUTH EACH WEEK, ON AN EMPTY STOMACH BEFORE BREAKFAST WITH 8oz OF WATER AND REMAIN UPRIGHT FOR :30   CALCIUM CARBONATE-VITAMIN D PO Take 2 tablets by mouth daily.    Cyanocobalamin (VITAMIN B-12) 5000 MCG TBDP Take 1 tablet by mouth daily at 6 (six) AM.   latanoprost (XALATAN) 0.005 % ophthalmic solution Place 2 drops into both eyes at bedtime.   meloxicam (MOBIC) 15 MG tablet Take 15 mg by mouth daily.   rosuvastatin (CRESTOR) 20 MG tablet TAKE 1 TABLET BY MOUTH ONCE EVERY EVENING   No facility-administered medications prior to visit.    Review of Systems  Musculoskeletal:  Positive for arthralgias.  All other systems reviewed and are negative.   Last CBC Lab Results  Component Value Date   WBC 6.8 11/15/2019   HGB 13.8 11/15/2019   HCT 42.8 11/15/2019   MCV 96 11/15/2019   MCH 31.1 11/15/2019   RDW 12.8 11/15/2019   PLT 285 03/83/3383   Last metabolic panel Lab Results  Component Value Date   GLUCOSE 91 05/24/2021   NA 141 05/24/2021   K 4.9 05/24/2021   CL 104 05/24/2021   CO2 26 05/24/2021   BUN 20 05/24/2021   CREATININE 0.63 05/24/2021   EGFR 92 05/24/2021   CALCIUM 9.5 05/24/2021   PROT 7.3 05/24/2021   ALBUMIN 4.6 05/24/2021   LABGLOB 2.7 05/24/2021   AGRATIO 1.7 05/24/2021   BILITOT 0.4 05/24/2021   ALKPHOS 72 05/24/2021   AST 20 05/24/2021   ALT 15 05/24/2021   ANIONGAP 6  08/05/2015   Last lipids Lab Results  Component Value Date   CHOL 163 05/24/2021   HDL 58 05/24/2021   LDLCALC 87 05/24/2021   TRIG 97 05/24/2021   CHOLHDL 2.8 05/24/2021   Last thyroid functions Lab Results  Component Value Date   TSH 3.880 11/15/2019      Objective    BP 127/75 (BP Location: Left Arm, Patient Position: Sitting, Cuff Size: Large)   Pulse 66   Temp 98.4 F (36.9 C) (Oral)   Resp 16   Ht 5' 3"  (1.6 m)  Wt 134 lb 14.4 oz (61.2 kg)   BMI 23.90 kg/m  BP Readings from Last 3 Encounters:  01/28/22 127/75  08/09/21 (!) 127/53  07/30/21 118/69   Wt Readings from Last 3 Encounters:  01/28/22 134 lb 14.4 oz (61.2 kg)  08/09/21 134 lb (60.8 kg)  07/30/21 137 lb (62.1 kg)      Physical Exam Vitals reviewed.  Constitutional:      General: She is not in acute distress.    Appearance: Normal appearance. She is well-developed. She is not diaphoretic.  HENT:     Head: Normocephalic and atraumatic.     Right Ear: Tympanic membrane, ear canal and external ear normal.     Left Ear: Tympanic membrane, ear canal and external ear normal.     Nose: Nose normal.     Mouth/Throat:     Mouth: Mucous membranes are moist.     Pharynx: Oropharynx is clear. No oropharyngeal exudate.  Eyes:     General: No scleral icterus.    Conjunctiva/sclera: Conjunctivae normal.     Pupils: Pupils are equal, round, and reactive to light.  Neck:     Thyroid: No thyromegaly.  Cardiovascular:     Rate and Rhythm: Normal rate and regular rhythm.     Pulses: Normal pulses.     Heart sounds: Normal heart sounds. No murmur heard. Pulmonary:     Effort: Pulmonary effort is normal. No respiratory distress.     Breath sounds: Normal breath sounds. No wheezing or rales.  Abdominal:     General: There is no distension.     Palpations: Abdomen is soft.     Tenderness: There is no abdominal tenderness.  Musculoskeletal:        General: No deformity.     Cervical back: Neck supple.      Right lower leg: No edema.     Left lower leg: No edema.  Lymphadenopathy:     Cervical: No cervical adenopathy.  Skin:    General: Skin is warm and dry.     Findings: No rash.  Neurological:     Mental Status: She is alert and oriented to person, place, and time. Mental status is at baseline.     Sensory: No sensory deficit.     Motor: No weakness.     Gait: Gait normal.  Psychiatric:        Mood and Affect: Mood normal.        Behavior: Behavior normal.        Thought Content: Thought content normal.       Last depression screening scores    01/28/2022   10:24 AM 05/24/2021    2:36 PM 05/13/2021    3:39 PM  PHQ 2/9 Scores  PHQ - 2 Score 0 0 0  PHQ- 9 Score 0 0    Last fall risk screening    01/28/2022   10:24 AM  [place] in the past year? 0  Number falls in past yr: 0  Injury with Fall? 0  Risk for fall due to : No Fall Risks  Follow up Falls evaluation completed   Last Audit-C alcohol use screening    01/28/2022   10:25 AM  Alcohol Use Disorder Test (AUDIT)  1. How often do you have a drink containing alcohol? 4  2. How many drinks containing alcohol do you have on a typical day when you are drinking? 0  3. How often do you have six or  more drinks on one occasion? 0  AUDIT-C Score 4   A score of 3 or more in women, and 4 or more in men indicates increased risk for alcohol abuse, EXCEPT if all of the points are from question 1   No results found for any visits on 01/28/22.  Assessment & Plan    Routine Health Maintenance and Physical Exam  Exercise Activities and Dietary recommendations  Goals      DIET - EAT MORE FRUITS AND VEGETABLES     DIET - INCREASE WATER INTAKE     Recommend increasing water intake to 4-6 glasses a day.         Immunization History  Administered Date(s) Administered   Fluad Quad(high Dose 65+) 01/30/2019, 01/27/2021   Influenza Split 01/16/2009, 01/22/2010, 03/21/2012   Influenza, High Dose Seasonal PF  04/23/2014, 04/20/2017, 02/28/2018, 01/13/2020   Influenza,inj,Quad PF,6+ Mos 03/27/2013   PFIZER(Purple Top)SARS-COV-2 Vaccination 05/25/2019, 06/15/2019, 01/09/2020   Pfizer Covid-19 Vaccine Bivalent Booster 22yr & up 09/21/2020   Pneumococcal Conjugate-13 04/23/2014   Pneumococcal Polysaccharide-23 03/18/2011   Tdap 07/06/2005   Zoster, Live 01/16/2009    Health Maintenance  Topic Date Due   Zoster Vaccines- Shingrix (1 of 2) Never done   TETANUS/TDAP  07/07/2015   COVID-19 Vaccine (5 - Pfizer series) 01/21/2021   INFLUENZA VACCINE  11/09/2021   MAMMOGRAM  02/12/2023   DEXA SCAN  07/14/2023   COLONOSCOPY (Pts 45-457yrInsurance coverage will need to be confirmed)  07/23/2024   Pneumonia Vaccine 6531Years old  Completed   Hepatitis C Screening  Completed   HPV VACCINES  Aged Out    Discussed health benefits of physical activity, and encouraged her to engage in regular exercise appropriate for her age and condition.  Problem List Items Addressed This Visit       Musculoskeletal and Integument   OP (osteoporosis)    On fosamax for about 3 yrs (of actually taking it) Plan for drug holiday at 5 years Reviewed last DEXA        Other   Hypercholesteremia    Previously well controlled Continue statin Repeat FLP and CMP      Relevant Orders   Comprehensive metabolic panel   Lipid panel   Elevated TSH    Previously elevated, but there was a diagnosis of hypothyroidism in chart She does not have hypo or hyperthyroidism and her TSH has been normal for many years Can continue to monitor annually, but no need for medications      Relevant Orders   TSH   Other Visit Diagnoses     Encounter for annual physical exam    -  Primary   Relevant Orders   TSH   Comprehensive metabolic panel   Lipid panel   Screening mammogram for breast cancer       Relevant Orders   MM 3D SCREEN BREAST BILATERAL        Return in about 1 year (around 01/29/2023) for CPE.     I,  AnLavon PaganiniMD, have reviewed all documentation for this visit. The documentation on 01/28/22 for the exam, diagnosis, procedures, and orders are all accurate and complete.   Jordin Vicencio, AnDionne BucyMD, MPH [facility]

## 2022-01-28 NOTE — Patient Instructions (Signed)
Call [facility] to schedule a mammogram [phone]  

## 2022-01-29 LAB — LIPID PANEL
Chol/HDL Ratio: 2.5 ratio (ref 0.0–4.4)
Cholesterol, Total: 165 mg/dL (ref 100–199)
HDL: 65 mg/dL (ref >39)
LDL Chol Calc (NIH): 83 mg/dL (ref 0–99)
Triglycerides: 95 mg/dL (ref 0–149)
VLDL Cholesterol Cal: 17 mg/dL (ref 5–40)

## 2022-01-29 LAB — COMPREHENSIVE METABOLIC PANEL
ALT: 19 IU/L (ref 0–32)
AST: 22 IU/L (ref 0–40)
Albumin/Globulin Ratio: 1.4 (ref 1.2–2.2)
Albumin: 4.2 g/dL (ref 3.8–4.8)
Alkaline Phosphatase: 79 IU/L (ref 44–121)
BUN/Creatinine Ratio: 23 (ref 12–28)
BUN: 15 mg/dL (ref 8–27)
Bilirubin Total: 0.6 mg/dL (ref 0.0–1.2)
CO2: 26 mmol/L (ref 20–29)
Calcium: 9.4 mg/dL (ref 8.7–10.3)
Chloride: 104 mmol/L (ref 96–106)
Creatinine, Ser: 0.66 mg/dL (ref 0.57–1.00)
Globulin, Total: 3.1 g/dL (ref 1.5–4.5)
Glucose: 102 mg/dL — ABNORMAL HIGH (ref 70–99)
Potassium: 4.9 mmol/L (ref 3.5–5.2)
Sodium: 141 mmol/L (ref 134–144)
Total Protein: 7.3 g/dL (ref 6.0–8.5)
eGFR: 91 mL/min/{1.73_m2} (ref >59)

## 2022-01-29 LAB — TSH: TSH: 4.44 u[IU]/mL (ref 0.450–4.500)

## 2022-02-07 DIAGNOSIS — S86911A Strain of unspecified muscle(s) and tendon(s) at lower leg level, right leg, initial encounter: Secondary | ICD-10-CM | POA: Diagnosis not present

## 2022-03-08 ENCOUNTER — Ambulatory Visit
Admission: RE | Admit: 2022-03-08 | Discharge: 2022-03-08 | Disposition: A | Discharge status: Home / Self Care | Payer: PPO | Source: Ambulatory Visit | Attending: Family Medicine | Admitting: Family Medicine

## 2022-03-08 DIAGNOSIS — Z1231 Encounter for screening mammogram for malignant neoplasm of breast: Secondary | ICD-10-CM | POA: Diagnosis not present

## 2022-03-21 DIAGNOSIS — S86911A Strain of unspecified muscle(s) and tendon(s) at lower leg level, right leg, initial encounter: Secondary | ICD-10-CM | POA: Diagnosis not present

## 2022-07-14 ENCOUNTER — Ambulatory Visit: Payer: Self-pay | Admitting: *Deleted

## 2022-07-14 NOTE — Telephone Encounter (Signed)
     Summary: Blood after urination   Patient called in stated she is seeing blood when she wipes after urination, no pain or any other symptoms. She has noticed her urine has been darker than normal.This has been going on since Monday           Chief Complaint: blood in urine Symptoms: dark colored urine since Monday. Noted "fresh " blood after urinating when wiping after every void. Hx prolapsed bladder. Has been taking Aleeve 1 per day x 2 weeks.  Frequency: 07/11/22 Pertinent Negatives: Patient denies fever, no pain with urination no back/ flank pain, no clots noted .  Disposition: [] ED /[] Urgent Care (no appt availability in office) / [x] Appointment(In office/virtual)/ []  [facility]/ [] Home Care/ [] Refused Recommended Disposition /[] [facility]/ []  Follow-up with PCP Additional Notes:   Appt scheduled for tomorrow. Recommended if sx worsen go to UC /ED.     Reason for Disposition  [1] Pink or red-colored urine and likely from food (beets, rhubarb, red food dye) AND [2] lasts > 24 hours after stopping food  Answer Assessment - Initial Assessment Questions 1. COLOR of URINE: "Describe the color of the urine."  (e.g., tea-colored, pink, red, bloody) "Do you have blood clots in your urine?" (e.g., none, pea, grape, small coin)     Dark in color 2. ONSET: "When did the bleeding start?"      Monday 07/11/22 3. EPISODES: "How many times has there been blood in the urine?" or "How many times today?"     Yes today  4. PAIN with URINATION: "Is there any pain with passing your urine?" If Yes, ask: "How bad is the pain?"  (Scale 1-10; or mild, moderate, severe)    - MILD: Complains slightly about urination hurting.    - MODERATE: Interferes with normal activities.      - SEVERE: Excruciating, unwilling or unable to urinate because of the pain.      No  5. FEVER: "Do you have a fever?" If Yes, ask: "What is your temperature, how was it measured, and when did it  start?"     na 6. ASSOCIATED SYMPTOMS: "Are you passing urine more frequently than usual?"     Hx prolapsed bladder. 7. OTHER SYMPTOMS: "Do you have any other symptoms?" (e.g., back/flank pain, abdomen pain, vomiting)     Blood noted in urine "fresh blood" noted after wiping  8. PREGNANCY: "Is there any chance you are pregnant?" "When was your last menstrual period?"     na  Protocols used: Urine - Blood In-A-AH

## 2022-07-15 ENCOUNTER — Encounter: Payer: Self-pay | Admitting: Physician Assistant

## 2022-07-15 ENCOUNTER — Ambulatory Visit (INDEPENDENT_AMBULATORY_CARE_PROVIDER_SITE_OTHER): Payer: PPO | Admitting: Physician Assistant

## 2022-07-15 VITALS — BP 131/57 | HR 63 | Temp 97.6°F | Resp 24 | Wt 134.5 lb

## 2022-07-15 DIAGNOSIS — R319 Hematuria, unspecified: Secondary | ICD-10-CM

## 2022-07-15 DIAGNOSIS — R3915 Urgency of urination: Secondary | ICD-10-CM

## 2022-07-15 LAB — POCT URINALYSIS DIPSTICK
Bilirubin, UA: NEGATIVE
Glucose, UA: NEGATIVE
Ketones, UA: NEGATIVE
Nitrite, UA: NEGATIVE
Protein, UA: POSITIVE — AB
Spec Grav, UA: 1.02 (ref 1.010–1.025)
Urobilinogen, UA: 0.2 E.U./dL
pH, UA: 6 (ref 5.0–8.0)

## 2022-07-15 NOTE — Progress Notes (Signed)
I,Sulibeya S Dimas,acting as a Neurosurgeon for [employer], PA-C.,have documented all relevant documentation on the behalf of Debera Lat, PA-C,as directed by  [employer], PA-C while in the presence of [employer], PA-C.     Established patient visit   Patient: Amanda Walton   DOB: 21-Mar-1946   77 y.o. Female  MRN: [id number] Visit Date: 07/15/2022  Today's healthcare provider: Debera Lat, PA-C   Chief Complaint  Patient presents with   Hematuria   Subjective    Hematuria This is a new problem. The current episode started yesterday. The problem has been gradually improving since onset. She describes the hematuria as gross hematuria. She reports no clotting in her urine stream. She is experiencing no pain. Pertinent negatives include no abdominal pain, bladder pain, chills, dysuria, fever, flank pain, nausea or vomiting.    Former smoker, quit date in 1990 Endorses a little urge incontinence  Medications: Outpatient Medications Prior to Visit  Medication Sig   alendronate (FOSAMAX) 70 MG tablet TAKE ONE TABLET BY MOUTH EACH WEEK, ON AN EMPTY STOMACH BEFORE BREAKFAST WITH 8oz OF WATER AND REMAIN UPRIGHT FOR :30   CALCIUM CARBONATE-VITAMIN D PO Take 2 tablets by mouth daily.    Cyanocobalamin (VITAMIN B-12) 5000 MCG TBDP Take 1 tablet by mouth daily at 6 (six) AM.   GLUCOSAMINE-CALCIUM-VIT D PO Take 1 tablet by mouth daily in the afternoon.   latanoprost (XALATAN) 0.005 % ophthalmic solution Place 2 drops into both eyes at bedtime.   meloxicam (MOBIC) 15 MG tablet Take 15 mg by mouth daily.   rosuvastatin (CRESTOR) 20 MG tablet TAKE 1 TABLET BY MOUTH ONCE EVERY EVENING   No facility-administered medications prior to visit.    Review of Systems  Constitutional:  Negative for chills and fever.  Gastrointestinal:  Negative for abdominal pain, nausea and vomiting.  Genitourinary:  Positive for hematuria. Negative for dysuria and flank pain.       Objective    BP  (!) 131/57 (BP Location: Left Arm, Patient Position: Sitting, Cuff Size: Normal)   Pulse 63   Temp 97.6 F (36.4 C) (Temporal)   Resp (!) 24   Wt 134 lb 8 oz (61 kg)   SpO2 99%   BMI 23.83 kg/m    Physical Exam Vitals reviewed.  Constitutional:      General: She is not in acute distress.    Appearance: She is well-developed.  HENT:     Head: Normocephalic and atraumatic.  Eyes:     General: No scleral icterus.    Conjunctiva/sclera: Conjunctivae normal.  Cardiovascular:     Rate and Rhythm: Normal rate and regular rhythm.     Heart sounds: Normal heart sounds. No murmur heard. Pulmonary:     Effort: Pulmonary effort is normal. No respiratory distress.     Breath sounds: Normal breath sounds. No wheezing or rales.  Abdominal:     General: There is no distension.     Palpations: Abdomen is soft.     Tenderness: There is no abdominal tenderness. There is no guarding or rebound.  Skin:    General: Skin is warm and dry.     Capillary Refill: Capillary refill takes less than 2 seconds.     Findings: No rash.  Neurological:     Mental Status: She is alert and oriented to person, place, and time.  Psychiatric:        Behavior: Behavior normal.      Results for orders placed or performed  in visit on 07/15/22  POCT urinalysis dipstick  Result Value Ref Range   Color, UA yellow    Clarity, UA dark    Glucose, UA Negative Negative   Bilirubin, UA Negative    Ketones, UA Negative    Spec Grav, UA 1.020 1.010 - 1.025   Blood, UA trace    pH, UA 6.0 5.0 - 8.0   Protein, UA Positive (A) Negative   Urobilinogen, UA 0.2 0.2 or 1.0 E.U./dL   Nitrite, UA Negative    Leukocytes, UA Small (1+) (A) Negative    Assessment & Plan     1. Hematuria, unspecified type New problem X 2 days Uses pessaries for prolapse Former smoker Advised to drink plenty of water, consume healthy diet and continue daily exercise Advised to drink cranberry juice - POCT urinalysis dipstick  positive for large blood, trace of protein, small leukocytes Ordered microscopy and urine culture. - Ambulatory referral to Urology Will FU with PCP  No follow-ups on file.     The patient was advised to call back or seek an in-person evaluation if the symptoms worsen or if the condition fails to improve as anticipated.  I discussed the assessment and treatment plan with the patient. The patient was provided an opportunity to ask questions and all were answered. The patient agreed with the plan and demonstrated an understanding of the instructions.  I, Debera LatJanna Duane Trias, PA-C have reviewed all documentation for this visit. The documentation on 07/15/22 for the exam, diagnosis, procedures, and orders are all accurate and complete.  Debera LatJanna Jina Olenick, [facility], MMS [facility] [phone] (phone) [phone] (fax)  [facility]

## 2022-07-17 ENCOUNTER — Other Ambulatory Visit: Payer: Self-pay | Admitting: Physician Assistant

## 2022-07-17 DIAGNOSIS — N3001 Acute cystitis with hematuria: Secondary | ICD-10-CM

## 2022-07-17 MED ORDER — CEPHALEXIN 500 MG PO CAPS: 500.0000 mg | ORAL_CAPSULE | Freq: Two times a day (BID) | ORAL | 0 refills | Status: DC

## 2022-07-17 NOTE — Progress Notes (Signed)
Please, let pt know that on microscopy , there many bacteria and her WBC > 30, I will sent keflex to her pharmacy. Ur culture results are pending, depending on susceptibility test, abx could be changed.

## 2022-07-19 ENCOUNTER — Other Ambulatory Visit: Payer: Self-pay | Admitting: Physician Assistant

## 2022-07-19 DIAGNOSIS — N390 Urinary tract infection, site not specified: Secondary | ICD-10-CM

## 2022-07-19 LAB — URINE CULTURE

## 2022-07-19 LAB — URINALYSIS, MICROSCOPIC ONLY
Casts: NONE SEEN /lpf
RBC, Urine: >30 /hpf — AB (ref 0–2)
WBC, UA: >30 /hpf — AB (ref 0–5)

## 2022-07-19 MED ORDER — NITROFURANTOIN MONOHYD MACRO 100 MG PO CAPS: 100.0000 mg | ORAL_CAPSULE | Freq: Two times a day (BID) | ORAL | 0 refills | Status: DC

## 2022-08-16 ENCOUNTER — Ambulatory Visit (INDEPENDENT_AMBULATORY_CARE_PROVIDER_SITE_OTHER): Payer: PPO | Admitting: Urology

## 2022-08-16 VITALS — BP 133/72 | Ht 63.0 in | Wt 133.0 lb

## 2022-08-16 DIAGNOSIS — R3129 Other microscopic hematuria: Secondary | ICD-10-CM | POA: Diagnosis not present

## 2022-08-16 DIAGNOSIS — Z87440 Personal history of urinary (tract) infections: Secondary | ICD-10-CM | POA: Diagnosis not present

## 2022-08-16 DIAGNOSIS — N819 Female genital prolapse, unspecified: Secondary | ICD-10-CM | POA: Diagnosis not present

## 2022-08-16 DIAGNOSIS — N3946 Mixed incontinence: Secondary | ICD-10-CM | POA: Diagnosis not present

## 2022-08-16 LAB — URINALYSIS, COMPLETE
Bilirubin, UA: NEGATIVE
Glucose, UA: NEGATIVE
Ketones, UA: NEGATIVE
Leukocytes,UA: NEGATIVE
Nitrite, UA: NEGATIVE
Protein,UA: NEGATIVE
Specific Gravity, UA: 1.025 (ref 1.005–1.030)
Urobilinogen, Ur: 0.2 mg/dL (ref 0.2–1.0)
pH, UA: 5.5 (ref 5.0–7.5)

## 2022-08-16 LAB — MICROSCOPIC EXAMINATION: Epithelial Cells (non renal): >10 /hpf — AB (ref 0–10)

## 2022-08-16 MED ORDER — GEMTESA 75 MG PO TABS
1.0000 | ORAL_TABLET | Freq: Every day | ORAL | 0 refills | Status: DC
Start: 2022-08-16 — End: 2023-02-08

## 2022-08-16 NOTE — Progress Notes (Signed)
I,Amy L Pierron,acting as a scribe for Vanna Scotland, MD.,have documented all relevant documentation on the behalf of Vanna Scotland, MD,as directed by  Vanna Scotland, MD while in the presence of Vanna Scotland, MD.  08/16/2022 4:47 PM   Amanda Walton 10/07/1945 [id number]  Referring provider: Debera Lat, PA-C [address] [place],  [place] [address]  Chief Complaint  Patient presents with   Establish Care   Hematuria    HPI: 77 year-old female who presents today for further evaluation of gross hematuria.  She was seen evaluated in early February with UTI type symptoms, including gross hematuria. She ended up growing Enterococcus in her urine. She was prescribed antibiotics, which were subsequently changed based on culture and sensitivity data.   Her UA today showed 3-10 red blood cells per high power field and greater than 10 epithelial cells.   She reports having difficulty giving her urine sample today. She's very concerned because she's never had blood in her urine before with infections. Her infections are very infrequent, and the blood was just highly concerning to her. She also mentioned she has prolapses managed with a passery. With all of this blood and this one-time infection, she wonders if it's time to have her prolapse fixed. Overall, her prolapse doesn't bother her. She does have urgency, frequency type symptoms and occasional incontinence. She has nocturia x3 which is very bothersome to her. She tries to avoid beverages before nighttime. She's very careful about her intake. She probably doesn't drink enough water as she should. She does wear pads.   PMH: Past Medical History:  Diagnosis Date   Arthritis    Hyperlipidemia     Surgical History: Past Surgical History:  Procedure Laterality Date   TONSILLECTOMY AND ADENOIDECTOMY     TOTAL HIP ARTHROPLASTY Right 08/04/2015   Procedure: RIGHT TOTAL HIP ARTHROPLASTY ANTERIOR APPROACH;  Surgeon: Durene Romans, MD;  Location: [facility];  Service: Orthopedics;  Laterality: Right;   TUBAL LIGATION      Home Medications:  Allergies as of 08/16/2022       Reactions   Bactrim [sulfamethoxazole-trimethoprim] Rash   Headaches, muscle aches        Medication List        Accurate as of Aug 16, 2022  4:47 PM. If you have any questions, ask your nurse or doctor.          STOP taking these medications    cephALEXin 500 MG capsule Commonly known as: KEFLEX   nitrofurantoin (macrocrystal-monohydrate) 100 MG capsule Commonly known as: Macrobid       TAKE these medications    alendronate 70 MG tablet Commonly known as: FOSAMAX TAKE ONE TABLET BY MOUTH EACH WEEK, ON AN EMPTY STOMACH BEFORE BREAKFAST WITH 8oz OF WATER AND REMAIN UPRIGHT FOR :30   CALCIUM CARBONATE-VITAMIN D PO Take 2 tablets by mouth daily.   Gemtesa 75 MG Tabs Generic drug: Vibegron Take 1 tablet (75 mg total) by mouth daily.   GLUCOSAMINE-CALCIUM-VIT D PO Take 1 tablet by mouth daily in the afternoon.   latanoprost 0.005 % ophthalmic solution Commonly known as: XALATAN Place 2 drops into both eyes at bedtime.   rosuvastatin 20 MG tablet Commonly known as: CRESTOR TAKE 1 TABLET BY MOUTH ONCE EVERY EVENING   Vitamin B-12 5000 MCG Tbdp Take 1 tablet by mouth daily at 6 (six) AM.        Allergies:  Allergies  Allergen Reactions   Bactrim [Sulfamethoxazole-Trimethoprim] Rash    Headaches,  muscle aches    Family History: Family History  Problem Relation Age of Onset   Dementia Mother    Hypertension Father    Heart disease Father    Hyperlipidemia Sister    Pancreatic cancer Sister    Breast cancer Neg Hx     Social History:  reports that she quit smoking about 33 years ago. Her smoking use included cigarettes. She has never used smokeless tobacco. She reports current alcohol use of about 9.0 standard drinks of alcohol per week. She reports that she does not use drugs.   Physical Exam: BP  133/72   Ht 5\' 3"  (1.6 m)   Wt 133 lb (60.3 kg)   BMI 23.56 kg/m   Constitutional:  Alert and oriented, No acute distress. HEENT: [place] AT, moist mucus membranes.  Trachea midline, no masses. Neurologic: Grossly intact, no focal deficits, moving all 4 extremities. Psychiatric: Normal mood and affect.  Assessment & Plan:    History of hemorrhagic cystitis  - Certainly related to her infection and at this point in time does not require any further work-up.   - Her urine today does have some microscopic blood. In light of her recent infection and contaminated sample, this likely is not real microscopic hematuria.   - Will have her return in 1 month to re-evaluate.  2. Mixed incontinence  - Mostly bothered by her urgency, frequency symptoms.   - Gave samples of Gemtesa 75 mg. She will let us know how they work. And in the meantime she will check with her insurance to see what is covered on her formulary.  3. Pelvic organ prolapse  - We discussed the goals of treatment of pelvic organ prolapse, including cystocele. Briefly discussed sacrocolpopexy. Will give her a referral if she does want to pursue this.   - We also discussed the goals of care. Plan to address her overactive bladder symptoms and then reassess.   Return in about 1 month (around 09/16/2022) for UA, PVR.    [facility] [address], [address] [place], [place] [address] [phone]

## 2022-08-18 ENCOUNTER — Encounter: Payer: Self-pay | Admitting: Urology

## 2022-08-22 DIAGNOSIS — H40153 Residual stage of open-angle glaucoma, bilateral: Secondary | ICD-10-CM | POA: Diagnosis not present

## 2022-09-08 NOTE — Telephone Encounter (Signed)
completed

## 2022-09-20 ENCOUNTER — Ambulatory Visit: Payer: PPO | Admitting: Urology

## 2022-10-14 ENCOUNTER — Ambulatory Visit
Admission: EM | Admit: 2022-10-14 | Discharge: 2022-10-14 | Disposition: A | Discharge status: Home / Self Care | Payer: PPO | Attending: Emergency Medicine | Admitting: Emergency Medicine

## 2022-10-14 DIAGNOSIS — S46811A Strain of other muscles, fascia and tendons at shoulder and upper arm level, right arm, initial encounter: Secondary | ICD-10-CM | POA: Diagnosis not present

## 2022-10-14 MED ORDER — KETOROLAC TROMETHAMINE 30 MG/ML IJ SOLN
30.0000 mg | Freq: Once | INTRAMUSCULAR | Status: AC
  Administered 2022-10-14: 30 mg via INTRAMUSCULAR

## 2022-10-14 MED ORDER — PREDNISONE 20 MG PO TABS: 40.0000 mg | ORAL_TABLET | Freq: Every day | ORAL | 0 refills | Status: DC

## 2022-10-14 NOTE — ED Triage Notes (Signed)
Right shoulder pain that started 9 days ago, npo recent injury or fall. Tried ice, heat and aleve and meloxicam with no relief.

## 2022-10-14 NOTE — Discharge Instructions (Addendum)
Your pain is most likely caused by irritation to the muscles.  You may give an injection of Toradol here today in the office to help reduce inflammation which in turn should help with your pain, daily you will start to see some relief in 30 minutes to an hour  Starting tomorrow take prednisone every morning with food for 5 days, do not take any NSAIDs while using this medicine, may use Tylenol as needed if pain flares  You may use heating pad in 15 minute intervals as needed for additional comfort, or you may find comfort in using ice in 10-15 minutes over affected area  Begin stretching affected area daily for 10 minutes as tolerated to further loosen muscles   Whenever sitting and lying down place pillow underneath arm and behind back  If pain persist after recommended treatment or reoccurs if may be beneficial to follow up with orthopedic specialist for evaluation, this doctor specializes in the bones and can manage your symptoms long-term with options such as but not limited to imaging, medications or physical therapy

## 2022-10-14 NOTE — ED Provider Notes (Signed)
[facility]    CSN: [id number] Arrival date & time: 10/14/22  1100      History   Chief Complaint Chief Complaint  Patient presents with   Shoulder Pain    HPI Amanda Walton is a 77 y.o. female.   Patient presents for evaluation of right shoulder blade pain present for 9 days.  Pain occurring intermittently, worsened overnight interfering with sleep.  Has had similar symptoms in the past but typically do not persist for this long.  Intermittently radiating down the right arm.  Has attempted use of ice, Aleve, meloxicam, IcyHot and additional topical products which have been ineffective.  Denies injury or trauma, precipitating event or change in activity.  Denies numbness or tingling.     Past Medical History:  Diagnosis Date   Arthritis    Hyperlipidemia     Patient Active Problem List   Diagnosis Date Noted   Elevated TSH 05/24/2021   De Quervain's tenosynovitis, right 07/04/2017   S/P right THA, AA 08/04/2015   Arthritis 05/20/2015   Hematuria 05/20/2015   Hypercholesteremia 01/21/2009   OP (osteoporosis) 07/13/2006   Displacement of lumbar intervertebral disc without myelopathy 11/13/2004    Past Surgical History:  Procedure Laterality Date   TONSILLECTOMY AND ADENOIDECTOMY     TOTAL HIP ARTHROPLASTY Right 08/04/2015   Procedure: RIGHT TOTAL HIP ARTHROPLASTY ANTERIOR APPROACH;  Surgeon: Durene Romans, MD;  Location: [facility];  Service: Orthopedics;  Laterality: Right;   TUBAL LIGATION      OB History     Gravida  2   Para  2   Term  2   Preterm      AB      Living  1      SAB      IAB      Ectopic      Multiple      Live Births  2            Home Medications    Prior to Admission medications   Medication Sig Start Date End Date Taking? Authorizing Provider  alendronate (FOSAMAX) 70 MG tablet TAKE ONE TABLET BY MOUTH EACH WEEK, ON AN EMPTY STOMACH BEFORE BREAKFAST WITH 8oz OF WATER AND REMAIN UPRIGHT FOR :30 10/29/21  Yes  Bacigalupo, Marzella Schlein, MD  CALCIUM CARBONATE-VITAMIN D PO Take 2 tablets by mouth daily.  07/13/06  Yes [provider]  Cyanocobalamin (VITAMIN B-12) 5000 MCG TBDP Take 1 tablet by mouth daily at 6 (six) AM.   Yes [provider]  GLUCOSAMINE-CALCIUM-VIT D PO Take 1 tablet by mouth daily in the afternoon.   Yes [provider]  latanoprost (XALATAN) 0.005 % ophthalmic solution Place 2 drops into both eyes at bedtime. 11/13/19  Yes [provider]  rosuvastatin (CRESTOR) 20 MG tablet TAKE 1 TABLET BY MOUTH ONCE EVERY EVENING 11/16/21  Yes Bacigalupo, Marzella Schlein, MD  Vibegron (GEMTESA) 75 MG TABS Take 1 tablet (75 mg total) by mouth daily. 08/16/22   Vanna Scotland, MD    Family History Family History  Problem Relation Age of Onset   Dementia Mother    Hypertension Father    Heart disease Father    Hyperlipidemia Sister    Pancreatic cancer Sister    Breast cancer Neg Hx     Social History Social History   Tobacco Use   Smoking status: Former    Types: Cigarettes    Quit date: 04/10/1989    Years since quitting: 79.5  Smokeless tobacco: Never  Vaping Use   Vaping Use: Never used  Substance Use Topics   Alcohol use: Yes    Alcohol/week: 9.0 standard drinks of alcohol    Types: 9 Glasses of wine per week    Comment: 1 glass M-F, a couple on the weekend   Drug use: No     Allergies   Patient has no active allergies.   Review of Systems Review of Systems   Physical Exam Triage Vital Signs ED Triage Vitals [10/14/22 1209]  Enc Vitals Group     BP (!) 147/83     Pulse Rate 61     Resp 16     Temp 98.1 F (36.7 C)     Temp src      SpO2 95 %     Weight      Height      Head Circumference      Peak Flow      Pain Score 7     Pain Loc      Pain Edu?      Excl. in GC?    No data found.  Updated Vital Signs BP (!) 147/83 (BP Location: Left Arm)   Pulse 61   Temp 98.1 F (36.7 C)   Resp 16   SpO2 95%   Visual Acuity Right  Eye Distance:   Left Eye Distance:   Bilateral Distance:    Right Eye Near:   Left Eye Near:    Bilateral Near:     Physical Exam Constitutional:      Appearance: Normal appearance.  Eyes:     Extraocular Movements: Extraocular movements intact.  Pulmonary:     Effort: Pulmonary effort is normal.  Musculoskeletal:       Arms:     Comments: Point tenderness to the center of the right trapezius muscle without ecchymosis, swelling or deformity, no tenderness or involvement of the shoulder joint, has full range of motion of the right arm  Neurological:     Mental Status: She is alert and oriented to person, place, and time. Mental status is at baseline.      UC Treatments / Results  Labs (all labs ordered are listed, but only abnormal results are displayed) Labs Reviewed - No data to display  EKG   Radiology No results found.  Procedures Procedures (including critical care time)  Medications Ordered in UC Medications - No data to display  Initial Impression / Assessment and Plan / UC Course  I have reviewed the triage vital signs and the nursing notes.  Pertinent labs & imaging results that were available during my care of the patient were reviewed by me and considered in my medical decision making (see chart for details).  Right trapezius strain, initial encounter  Etiology most likely muscular, low suspicion for any bony involvement due to lack of injury, discussed this with patient, Toradol injection given in office and prescribed prednisone for outpatient use, recommended RICE heat massage stretching and activity as tolerated, walking referral given to orthopedics if symptoms persist or worsen Final Clinical Impressions(s) / UC Diagnoses   Final diagnoses:  None   Discharge Instructions   None    ED Prescriptions   None    PDMP not reviewed this encounter.   Valinda Hoar, NP 10/14/22 1506

## 2022-11-17 ENCOUNTER — Other Ambulatory Visit: Payer: Self-pay | Admitting: Family Medicine

## 2022-11-17 DIAGNOSIS — E7800 Pure hypercholesterolemia, unspecified: Secondary | ICD-10-CM

## 2022-12-27 DIAGNOSIS — H40153 Residual stage of open-angle glaucoma, bilateral: Secondary | ICD-10-CM | POA: Diagnosis not present

## 2023-01-10 ENCOUNTER — Other Ambulatory Visit: Payer: Self-pay | Admitting: Family Medicine

## 2023-01-10 DIAGNOSIS — Z9189 Other specified personal risk factors, not elsewhere classified: Secondary | ICD-10-CM

## 2023-01-10 DIAGNOSIS — M8580 Other specified disorders of bone density and structure, unspecified site: Secondary | ICD-10-CM

## 2023-01-10 NOTE — Telephone Encounter (Signed)
Requested medication (s) are due for refill today: expired medication  Requested medication (s) are on the active medication list: yes  Last refill:  10/29/21 #12 3 refills  Future visit scheduled: yes in 1 month  Notes to clinic:   expired medication. Do you want to refill Rx?     Requested Prescriptions  Pending Prescriptions Disp Refills   alendronate (FOSAMAX) 70 MG tablet [Pharmacy Med Name: ALENDRONATE SODIUM 70 MG TAB] 12 tablet 3    Sig: TAKE ONE TABLET BY MOUTH EACH WEEK, ON AN EMPTY STOMACH BEFORE BREAKFAST WITH 8oz OF WATER AND REMAIN UPRIGHT FOR :30     Endocrinology:  Bisphosphonates Failed - 01/10/2023  9:12 AM      Failed - Vitamin D in normal range and within 360 days    No results found for: "NU2725DG6", "YQ0347QQ5", "VD125OH2TOT", "25OHVITD3", "25OHVITD2", "25OHVITD1", "VD25OH"       Failed - Mg Level in normal range and within 360 days    No results found for: "MG"       Failed - Phosphate in normal range and within 360 days    No results found for: "PHOS"       Passed - Ca in normal range and within 360 days    Calcium  Date Value Ref Range Status  01/28/2022 9.4 8.7 - 10.3 mg/dL Final         Passed - Cr in normal range and within 360 days    Creatinine, Ser  Date Value Ref Range Status  01/28/2022 0.66 0.57 - 1.00 mg/dL Final         Passed - eGFR is 30 or above and within 360 days    GFR calc Af Amer  Date Value Ref Range Status  11/15/2019 104 >59 mL/min/1.73 Final    Comment:    **[facility] currently reports eGFR in compliance with the current**   recommendations of the [employer]. [facility] will   update reporting as new guidelines are published from the NKF-ASN   Task force.    GFR calc non Af Amer  Date Value Ref Range Status  11/15/2019 90 >59 mL/min/1.73 Final   eGFR  Date Value Ref Range Status  01/28/2022 91 >59 mL/min/1.73 Final         Passed - Valid encounter within last 12 months    Recent Outpatient Visits            5 months ago Hematuria, unspecified type   [place] [facility] [place], [place], PA-C   11 months ago Encounter for annual physical exam   [place] [facility] [place], Marzella Schlein, MD   1 year ago Medication reaction, initial encounter   [facility] Malva Limes, MD   1 year ago Upper back strain, initial encounter   [facility] [facility] Alfredia Ferguson, PA-C   1 year ago Hypercholesteremia   [facility] [place], Marzella Schlein, MD       Future Appointments             In 1 month Bacigalupo, Marzella Schlein, MD [facility]            Passed - Bone Mineral Density or Dexa Scan completed in the last 2 years

## 2023-01-26 ENCOUNTER — Other Ambulatory Visit: Payer: Self-pay | Admitting: Family Medicine

## 2023-01-26 ENCOUNTER — Encounter: Payer: Self-pay | Admitting: Family Medicine

## 2023-01-26 DIAGNOSIS — Z1231 Encounter for screening mammogram for malignant neoplasm of breast: Secondary | ICD-10-CM

## 2023-02-01 DIAGNOSIS — B009 Herpesviral infection, unspecified: Secondary | ICD-10-CM | POA: Diagnosis not present

## 2023-02-01 DIAGNOSIS — Z86018 Personal history of other benign neoplasm: Secondary | ICD-10-CM | POA: Diagnosis not present

## 2023-02-01 DIAGNOSIS — L578 Other skin changes due to chronic exposure to nonionizing radiation: Secondary | ICD-10-CM | POA: Diagnosis not present

## 2023-02-03 ENCOUNTER — Encounter: Payer: PPO | Admitting: Family Medicine

## 2023-02-05 NOTE — Patient Instructions (Incomplete)
It was a pleasure meeting you today. Thank you for allowing me to take part in your health care.  Our goals for today as we discussed include:  Schedule fasting lab appointment.  Fast for 10 hours  Received Flu vaccine today  This is a list of the screening recommended for you and due dates:  Health Maintenance  Topic Date Due   Zoster (Shingles) Vaccine (1 of 2) 03/06/1996   DTaP/Tdap/Td vaccine (2 - Td or Tdap) 07/07/2015   Medicare Annual Wellness Visit  05/13/2022   Flu Shot  11/10/2022   COVID-19 Vaccine (5 - 2023-24 season) 12/11/2022   DEXA scan (bone density measurement)  07/14/2023   Mammogram  03/08/2024   Pneumonia Vaccine  Completed   Hepatitis C Screening  Completed   HPV Vaccine  Aged Out   Colon Cancer Screening  Discontinued     If you have any questions or concerns, please do not hesitate to call the office at [phone].  I look forward to our next visit and until then take care and stay safe.  Regards,   Dana Allan, MD   [facility]

## 2023-02-05 NOTE — Progress Notes (Unsigned)
   SUBJECTIVE:  No chief complaint on file.  HPI ***  PERTINENT PMH / PSH: ***  OBJECTIVE:  There were no vitals taken for this visit.   Physical Exam     07/15/2022   10:52 AM 01/28/2022   10:24 AM 05/24/2021    2:36 PM 05/13/2021    3:39 PM 04/21/2020    3:37 PM  Depression screen PHQ 2/9  Decreased Interest 0 0 0 0 0  Down, Depressed, Hopeless 0 0 0 0 0  PHQ - 2 Score 0 0 0 0 0  Altered sleeping 1 0 0    Tired, decreased energy 0 0 0    Change in appetite 0 0 0    Feeling bad or failure about yourself  0 0 0    Trouble concentrating 0 0 0    Moving slowly or fidgety/restless 0 0 0    Suicidal thoughts 0 0 0    PHQ-9 Score 1 0 0    Difficult doing work/chores Not difficult at all Not difficult at all Not difficult at all         No data to display          ASSESSMENT/PLAN:  Need for hepatitis C screening test  Hypercholesteremia  Abnormal glucose  Vitamin D deficiency  Vitamin B 12 deficiency   PDMP reviewed***  No follow-ups on file.  Dana Allan, MD

## 2023-02-06 ENCOUNTER — Encounter: Payer: Self-pay | Admitting: Family Medicine

## 2023-02-06 ENCOUNTER — Ambulatory Visit (INDEPENDENT_AMBULATORY_CARE_PROVIDER_SITE_OTHER): Payer: PPO | Admitting: Family Medicine

## 2023-02-06 VITALS — BP 134/76 | HR 73 | Temp 98.4°F | Resp 16 | Ht 63.0 in | Wt 135.0 lb

## 2023-02-06 DIAGNOSIS — H6121 Impacted cerumen, right ear: Secondary | ICD-10-CM | POA: Diagnosis not present

## 2023-02-06 DIAGNOSIS — Z23 Encounter for immunization: Secondary | ICD-10-CM | POA: Diagnosis not present

## 2023-02-06 DIAGNOSIS — E7800 Pure hypercholesterolemia, unspecified: Secondary | ICD-10-CM | POA: Diagnosis not present

## 2023-02-06 DIAGNOSIS — E559 Vitamin D deficiency, unspecified: Secondary | ICD-10-CM | POA: Diagnosis not present

## 2023-02-06 DIAGNOSIS — R319 Hematuria, unspecified: Secondary | ICD-10-CM

## 2023-02-06 DIAGNOSIS — M810 Age-related osteoporosis without current pathological fracture: Secondary | ICD-10-CM

## 2023-02-06 DIAGNOSIS — H2640 Unspecified secondary cataract: Secondary | ICD-10-CM | POA: Diagnosis not present

## 2023-02-06 DIAGNOSIS — E538 Deficiency of other specified B group vitamins: Secondary | ICD-10-CM | POA: Diagnosis not present

## 2023-02-06 DIAGNOSIS — R7309 Other abnormal glucose: Secondary | ICD-10-CM

## 2023-02-06 DIAGNOSIS — Z1159 Encounter for screening for other viral diseases: Secondary | ICD-10-CM

## 2023-02-07 ENCOUNTER — Other Ambulatory Visit: Payer: Self-pay | Admitting: Family Medicine

## 2023-02-07 DIAGNOSIS — Z1231 Encounter for screening mammogram for malignant neoplasm of breast: Secondary | ICD-10-CM

## 2023-02-08 ENCOUNTER — Encounter: Payer: Self-pay | Admitting: Family Medicine

## 2023-02-08 DIAGNOSIS — R7309 Other abnormal glucose: Secondary | ICD-10-CM | POA: Insufficient documentation

## 2023-02-08 DIAGNOSIS — H6121 Impacted cerumen, right ear: Secondary | ICD-10-CM | POA: Diagnosis not present

## 2023-02-08 DIAGNOSIS — R319 Hematuria, unspecified: Secondary | ICD-10-CM | POA: Diagnosis not present

## 2023-02-08 DIAGNOSIS — E538 Deficiency of other specified B group vitamins: Secondary | ICD-10-CM | POA: Insufficient documentation

## 2023-02-08 DIAGNOSIS — E7800 Pure hypercholesterolemia, unspecified: Secondary | ICD-10-CM | POA: Diagnosis not present

## 2023-02-08 DIAGNOSIS — H269 Unspecified cataract: Secondary | ICD-10-CM | POA: Insufficient documentation

## 2023-02-08 DIAGNOSIS — E559 Vitamin D deficiency, unspecified: Secondary | ICD-10-CM | POA: Insufficient documentation

## 2023-02-08 DIAGNOSIS — Z23 Encounter for immunization: Secondary | ICD-10-CM

## 2023-02-08 DIAGNOSIS — Z1159 Encounter for screening for other viral diseases: Secondary | ICD-10-CM | POA: Insufficient documentation

## 2023-02-08 DIAGNOSIS — M810 Age-related osteoporosis without current pathological fracture: Secondary | ICD-10-CM | POA: Diagnosis not present

## 2023-02-08 DIAGNOSIS — H2640 Unspecified secondary cataract: Secondary | ICD-10-CM | POA: Diagnosis not present

## 2023-02-08 DIAGNOSIS — H6120 Impacted cerumen, unspecified ear: Secondary | ICD-10-CM | POA: Insufficient documentation

## 2023-02-08 NOTE — Assessment & Plan Note (Signed)
Follows with [facility] -Continue Lantoprost for eye health.

## 2023-02-08 NOTE — Assessment & Plan Note (Signed)
On high-dose B12 supplementation ( ) without known deficiency or malabsorption issues. -Consider reducing B12 dose pending review of previous B12 levels.

## 2023-02-08 NOTE — Assessment & Plan Note (Signed)
Evaluated by Urology Was thought to have been related to urine infection Recommend to follow up 1 month for repeat urine Has not had follow up appointment

## 2023-02-08 NOTE — Assessment & Plan Note (Signed)
On high-intensity statin (Crestor 20mg ) with family history of high cholesterol. Discussed potential side effects of high-intensity statin use in older adults and considered reducing dose. -Consider reducing Crestor dose pending review of previous cholesterol levels.

## 2023-02-08 NOTE — Assessment & Plan Note (Addendum)
Right ear blocked with wax. -Instill Debrox drops twice daily for 5 days. If no improvement, consider ear irrigation.

## 2023-02-08 NOTE — Assessment & Plan Note (Signed)
On Fosamax for approximately 4 years after a period of non-compliance. Discussed maximum benefit period of 5 years for Fosamax. -Continue Fosamax, calcium, and vitamin D. -Plan for DEXA scan in the next 1-2 years based on last scan.

## 2023-02-15 ENCOUNTER — Other Ambulatory Visit (INDEPENDENT_AMBULATORY_CARE_PROVIDER_SITE_OTHER): Payer: PPO

## 2023-02-15 DIAGNOSIS — R7309 Other abnormal glucose: Secondary | ICD-10-CM | POA: Diagnosis not present

## 2023-02-15 DIAGNOSIS — E538 Deficiency of other specified B group vitamins: Secondary | ICD-10-CM

## 2023-02-15 DIAGNOSIS — E7800 Pure hypercholesterolemia, unspecified: Secondary | ICD-10-CM | POA: Diagnosis not present

## 2023-02-15 DIAGNOSIS — E559 Vitamin D deficiency, unspecified: Secondary | ICD-10-CM | POA: Diagnosis not present

## 2023-02-15 DIAGNOSIS — Z1159 Encounter for screening for other viral diseases: Secondary | ICD-10-CM

## 2023-02-15 LAB — COMPREHENSIVE METABOLIC PANEL
ALT: 14 U/L (ref 0–35)
AST: 17 U/L (ref 0–37)
Albumin: 4.1 g/dL (ref 3.5–5.2)
Alkaline Phosphatase: 83 U/L (ref 39–117)
BUN: 23 mg/dL (ref 6–23)
CO2: 29 meq/L (ref 19–32)
Calcium: 9.3 mg/dL (ref 8.4–10.5)
Chloride: 105 meq/L (ref 96–112)
Creatinine, Ser: 0.66 mg/dL (ref 0.40–1.20)
GFR: 84.9 mL/min (ref >60.00)
Glucose, Bld: 93 mg/dL (ref 70–99)
Potassium: 4.8 meq/L (ref 3.5–5.1)
Sodium: 140 meq/L (ref 135–145)
Total Bilirubin: 0.4 mg/dL (ref 0.2–1.2)
Total Protein: 7.3 g/dL (ref 6.0–8.3)

## 2023-02-15 LAB — LIPID PANEL
Cholesterol: 155 mg/dL (ref 0–200)
HDL: 59.2 mg/dL (ref >39.00)
LDL Cholesterol: 84 mg/dL (ref 0–99)
NonHDL: 95.83
Total CHOL/HDL Ratio: 3
Triglycerides: 61 mg/dL (ref 0.0–149.0)
VLDL: 12.2 mg/dL (ref 0.0–40.0)

## 2023-02-15 LAB — CBC WITH DIFFERENTIAL/PLATELET
Basophils Absolute: 0.1 10*3/uL (ref 0.0–0.1)
Basophils Relative: 1.2 % (ref 0.0–3.0)
Eosinophils Absolute: 0.3 10*3/uL (ref 0.0–0.7)
Eosinophils Relative: 5.3 % — ABNORMAL HIGH (ref 0.0–5.0)
HCT: 43.1 % (ref 36.0–46.0)
Hemoglobin: 13.9 g/dL (ref 12.0–15.0)
Lymphocytes Relative: 25.1 % (ref 12.0–46.0)
Lymphs Abs: 1.4 10*3/uL (ref 0.7–4.0)
MCHC: 32.3 g/dL (ref 30.0–36.0)
MCV: 96.5 fL (ref 78.0–100.0)
Monocytes Absolute: 0.7 10*3/uL (ref 0.1–1.0)
Monocytes Relative: 12.4 % — ABNORMAL HIGH (ref 3.0–12.0)
Neutro Abs: 3.1 10*3/uL (ref 1.4–7.7)
Neutrophils Relative %: 56 % (ref 43.0–77.0)
Platelets: 329 10*3/uL (ref 150.0–400.0)
RBC: 4.47 Mil/uL (ref 3.87–5.11)
RDW: 13.3 % (ref 11.5–15.5)
WBC: 5.6 10*3/uL (ref 4.0–10.5)

## 2023-02-15 LAB — VITAMIN B12: Vitamin B-12: 1103 pg/mL — ABNORMAL HIGH (ref 211–911)

## 2023-02-15 LAB — VITAMIN D 25 HYDROXY (VIT D DEFICIENCY, FRACTURES): VITD: 30.36 ng/mL (ref 30.00–100.00)

## 2023-02-15 LAB — HEMOGLOBIN A1C: Hgb A1c MFr Bld: 5.9 % (ref 4.6–6.5)

## 2023-02-16 ENCOUNTER — Encounter: Payer: Self-pay | Admitting: Family Medicine

## 2023-02-16 LAB — HEPATITIS C ANTIBODY: Hepatitis C Ab: NONREACTIVE

## 2023-02-17 ENCOUNTER — Encounter: Payer: PPO | Admitting: Family Medicine

## 2023-02-18 ENCOUNTER — Encounter: Payer: Self-pay | Admitting: Family Medicine

## 2023-02-18 ENCOUNTER — Other Ambulatory Visit: Payer: Self-pay | Admitting: Family Medicine

## 2023-02-18 DIAGNOSIS — R7989 Other specified abnormal findings of blood chemistry: Secondary | ICD-10-CM

## 2023-02-18 MED ORDER — VITAMIN D (ERGOCALCIFEROL) 1.25 MG (50000 UNIT) PO CAPS: 50000.0000 [IU] | ORAL_CAPSULE | ORAL | 0 refills | Status: DC

## 2023-02-19 ENCOUNTER — Other Ambulatory Visit: Payer: Self-pay | Admitting: Family Medicine

## 2023-02-19 DIAGNOSIS — E7800 Pure hypercholesterolemia, unspecified: Secondary | ICD-10-CM

## 2023-02-19 MED ORDER — ROSUVASTATIN CALCIUM 10 MG PO TABS: 10.0000 mg | ORAL_TABLET | Freq: Every day | ORAL | 3 refills | Status: DC

## 2023-03-13 ENCOUNTER — Ambulatory Visit
Admission: RE | Admit: 2023-03-13 | Discharge: 2023-03-13 | Disposition: A | Discharge status: Home / Self Care | Payer: PPO | Source: Ambulatory Visit | Attending: Family Medicine | Admitting: Family Medicine

## 2023-03-13 DIAGNOSIS — Z1231 Encounter for screening mammogram for malignant neoplasm of breast: Secondary | ICD-10-CM | POA: Insufficient documentation

## 2023-04-13 IMAGING — US US BREAST*L* LIMITED INC AXILLA
1 series · 2 of 2 positions shown · non-contrast
Comparison: Previous exam(s).

CLINICAL DATA: Patient presents for left breast pain.

EXAM:
DIGITAL DIAGNOSTIC BILATERAL MAMMOGRAM WITH TOMOSYNTHESIS AND CAD;
ULTRASOUND LEFT BREAST LIMITED
TECHNIQUE: Bilateral digital diagnostic mammography and breast tomosynthesis
was performed. The images were evaluated with computer-aided
detection.; Targeted ultrasound examination of the left breast was
performed.

[Series 1: us breast*left* limited inc axilla · 0.06mm/px · 2 of 2 slices shown]
[im 1/2]
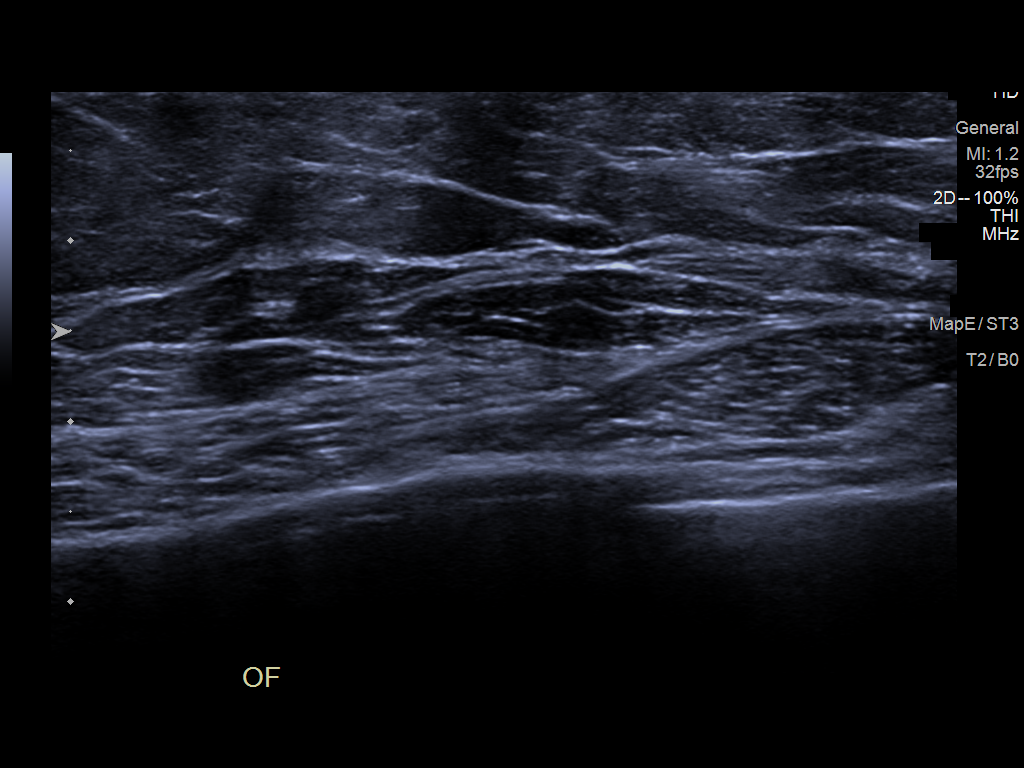
[im 2/2]
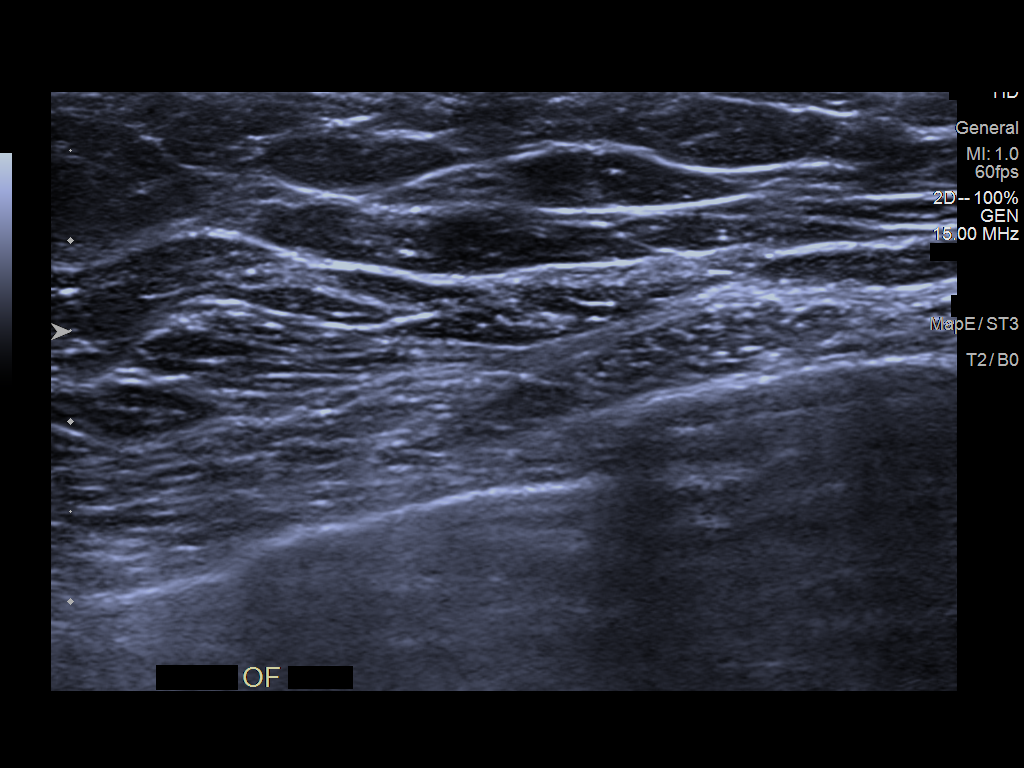

[2 of 2 positions shown; findings below may reference images not displayed]

ACR Breast Density Category c: The breast tissue is heterogeneously
dense, which may obscure small masses.
FINDINGS: No concerning masses, calcifications or distortion identified within
either breast. No suspicious abnormality underlying the palpable
marker upper-outer left breast.

Targeted ultrasound is performed, showing normal tissue without
suspicious mass within left.
IMPRESSION: No mammographic evidence for malignancy.

RECOMMENDATION:
Continued clinical evaluation for left breast pain.

Screening mammogram in one year.(Code:[id number])

I have discussed the findings and recommendations with the patient.
If applicable, a reminder letter will be sent to the patient
regarding the next appointment.

BI-RADS CATEGORY  2: Benign.

## 2023-04-25 DIAGNOSIS — H40153 Residual stage of open-angle glaucoma, bilateral: Secondary | ICD-10-CM | POA: Diagnosis not present

## 2023-06-06 ENCOUNTER — Other Ambulatory Visit: Payer: Self-pay | Admitting: Family Medicine

## 2023-06-06 DIAGNOSIS — R7989 Other specified abnormal findings of blood chemistry: Secondary | ICD-10-CM

## 2023-08-09 ENCOUNTER — Telehealth: Payer: Self-pay

## 2023-08-09 DIAGNOSIS — E7800 Pure hypercholesterolemia, unspecified: Secondary | ICD-10-CM

## 2023-08-09 NOTE — Telephone Encounter (Signed)
 Copied from CRM [id number]. Topic: General - Other >> Aug 09, 2023 10:34 AM Dorisann Garre T wrote: Reason for CRM: patient is needing a call back regarding if she needs to schedule a follow up lab appt she would like a call back regarding this

## 2023-08-11 NOTE — Telephone Encounter (Signed)
 Per last result note message pt was to return in 6 months for repeat lipid panel. I have pended the labs for your approval. If needed let me know so a lab appt can be scheduled.

## 2023-08-11 NOTE — Addendum Note (Signed)
 Addended by: Chadwick Colonel on: 08/11/2023 09:07 AM   Modules accepted: Orders

## 2023-08-12 ENCOUNTER — Encounter: Payer: Self-pay | Admitting: Family Medicine

## 2023-08-14 ENCOUNTER — Other Ambulatory Visit: Payer: Self-pay | Admitting: Family Medicine

## 2023-08-14 DIAGNOSIS — E7800 Pure hypercholesterolemia, unspecified: Secondary | ICD-10-CM

## 2023-08-16 ENCOUNTER — Other Ambulatory Visit (INDEPENDENT_AMBULATORY_CARE_PROVIDER_SITE_OTHER)

## 2023-08-16 DIAGNOSIS — E7800 Pure hypercholesterolemia, unspecified: Secondary | ICD-10-CM | POA: Diagnosis not present

## 2023-08-16 LAB — LIPID PANEL
Cholesterol: 185 mg/dL (ref 0–200)
HDL: 60.9 mg/dL (ref >39.00)
LDL Cholesterol: 107 mg/dL — ABNORMAL HIGH (ref 0–99)
NonHDL: 123.78
Total CHOL/HDL Ratio: 3
Triglycerides: 86 mg/dL (ref 0.0–149.0)
VLDL: 17.2 mg/dL (ref 0.0–40.0)

## 2023-08-17 ENCOUNTER — Encounter: Payer: Self-pay | Admitting: Family Medicine

## 2023-08-18 ENCOUNTER — Other Ambulatory Visit: Payer: Self-pay | Admitting: Family Medicine

## 2023-08-18 ENCOUNTER — Telehealth: Payer: Self-pay

## 2023-08-18 DIAGNOSIS — E7800 Pure hypercholesterolemia, unspecified: Secondary | ICD-10-CM

## 2023-08-18 MED ORDER — ROSUVASTATIN CALCIUM 20 MG PO TABS: 20.0000 mg | ORAL_TABLET | Freq: Every day | ORAL | 3 refills | Status: AC

## 2023-08-18 NOTE — Telephone Encounter (Signed)
 Patient says that she's been waiting on the doctor to let her know about her lab results and what to do about the rosuvastatin . Advised results have not been read by provider and when they are, she will receive a call. She says she has 5 days left of rosuvastatin  and will need to know what dosage to take.  Copied from CRM [id number]. Topic: Clinical - Lab/Test Results >> Aug 18, 2023  4:26 PM Chuck Crater wrote: Reason for CRM: Patient has additional questions in regards to her lab results.

## 2023-08-18 NOTE — Telephone Encounter (Signed)
 Copied from CRM [id number]. Topic: Clinical - Prescription Issue >> Aug 18, 2023  4:26 PM Chuck Crater wrote: Reason for CRM: Patient wants to increase her dosage of rosuvastatin  (CRESTOR ) 10 MG tablet to improve her cholesterol.

## 2023-08-21 ENCOUNTER — Telehealth: Payer: Self-pay | Admitting: *Deleted

## 2023-08-21 NOTE — Telephone Encounter (Signed)
 Complete spoke to [place].

## 2023-08-21 NOTE — Telephone Encounter (Signed)
 Patient called with complaint that she called on 08/17/23 concerned about labs not being resulted, and increase on Crestor  not being sent and then no notification on 08/18/23 medication had been sent to pharmacy. Advised patient that I would investigate and speak with provider concerning handling of the message. Patient stated she was pleased. I advised patient she could call me with any more concerns.

## 2023-08-23 ENCOUNTER — Ambulatory Visit: Payer: Self-pay | Admitting: Family Medicine

## 2023-08-23 ENCOUNTER — Encounter: Payer: Self-pay | Admitting: Family Medicine

## 2023-08-25 NOTE — Telephone Encounter (Signed)
 Noted.

## 2023-09-13 ENCOUNTER — Encounter

## 2023-10-04 ENCOUNTER — Telehealth: Payer: Self-pay

## 2023-10-04 NOTE — Telephone Encounter (Signed)
 Pt scheduled to see dr. Narendra 07/09 at 1pm

## 2023-10-04 NOTE — Telephone Encounter (Signed)
 Copied from CRM [id number]. Topic: Clinical - Medical Advice >> Oct 04, 2023  9:20 AM Ivette P wrote: Reason for CRM: tenderness in right breast. did mammogram and came back normal in the past years. 6 months from next [facility]. should go through the process? Or should she wait? Pt would like for someone to follow up and give advice on what her next steps would  be.    Pt callback [phone]

## 2023-10-09 DIAGNOSIS — H40153 Residual stage of open-angle glaucoma, bilateral: Secondary | ICD-10-CM | POA: Diagnosis not present

## 2023-10-18 ENCOUNTER — Ambulatory Visit: Admitting: Internal Medicine

## 2023-10-23 ENCOUNTER — Ambulatory Visit (INDEPENDENT_AMBULATORY_CARE_PROVIDER_SITE_OTHER): Admitting: Internal Medicine

## 2023-10-23 ENCOUNTER — Encounter: Payer: Self-pay | Admitting: Internal Medicine

## 2023-10-23 VITALS — BP 116/84 | HR 72 | Temp 98.3°F | Ht 63.0 in | Wt 134.2 lb

## 2023-10-23 DIAGNOSIS — E559 Vitamin D deficiency, unspecified: Secondary | ICD-10-CM | POA: Diagnosis not present

## 2023-10-23 DIAGNOSIS — E7800 Pure hypercholesterolemia, unspecified: Secondary | ICD-10-CM | POA: Diagnosis not present

## 2023-10-23 DIAGNOSIS — E538 Deficiency of other specified B group vitamins: Secondary | ICD-10-CM | POA: Diagnosis not present

## 2023-10-23 DIAGNOSIS — N63 Unspecified lump in unspecified breast: Secondary | ICD-10-CM | POA: Diagnosis not present

## 2023-10-23 NOTE — Assessment & Plan Note (Signed)
-   This problem is chronic and stable -Patient has history of vitamin D  deficiency and is currently on over-the-counter vitamin D  -Her last vitamin D  level was low normal at 30.36 - we will recheck vitamin D  level today

## 2023-10-23 NOTE — Patient Instructions (Addendum)
-   It was a pleasure meeting you today -I have put in for diagnostic mammogram and ultrasound of your left breast for further evaluation of the left breast nodule -We will check some blood work today including your kidney function, liver function, vitamin B12 and vitamin D  levels -You did have a cholesterol checked recently in May and we do not need to repeat this today -Please contact us  with any questions or concerns or if you need any refills -Please contact us  if your symptoms worsen

## 2023-10-23 NOTE — Assessment & Plan Note (Addendum)
-   This problem is chronic and stable -Patient's last LDL in May was mildly elevated at 107 - Will continue with rosuvastatin  20 mg daily -No further workup at this time

## 2023-10-23 NOTE — Assessment & Plan Note (Signed)
-   Patient complained of a feeling of intermittent fullness at the tail of the left breast and states that this feeling has been more frequent than before -In the presence of a chaperone (CMA [place]) partial left breast exam was done in the area that she indicated and she was felt to have a small mobile nodule -Patient denies any weight loss or fevers or chills.  No diaphoresis or night sweats.  No nipple discharge noted -Patient did have a diagnostic mammogram in 2022 and screening mammograms in 2023 and 2024 which were unrevealing -Would obtain a left-sided diagnostic mammogram as well as ultrasound for further evaluation -No further workup at this time

## 2023-10-23 NOTE — Progress Notes (Signed)
 Acute Office Visit  Subjective:     Patient ID: Amanda Walton, female    DOB: 1945-10-25, 78 y.o.   MRN: [id number]  Chief Complaint  Patient presents with   Acute Visit    Right breast tenderness    HPI Patient is in today for follow-up of intermittent left breast fullness.  Patient denies any pain or tenderness in the left breast but does state that she occasionally feels like something is there.  She had a screening mammogram in November 2023 and December 2024 which was normal as well as a diagnostic mammogram in November 2022 which was unrevealing.  Patient states that she gets this feeling a few times a week and this is more often than she had it before.  No fevers or chills, no weight loss, no diaphoresis.  Patient denies any other complaints today and is compliant with all her medications.  Review of Systems  Constitutional: Negative.   HENT: Negative.    Respiratory: Negative.    Cardiovascular: Negative.   Musculoskeletal: Negative.   Skin:        Complains of left breast fullness intermittently  Neurological: Negative.   Psychiatric/Behavioral: Negative.          Objective:    BP 116/84   Pulse 72   Temp 98.3 F (36.8 C)   Ht 5' 3 (1.6 m)   Wt 134 lb 3.2 oz (60.9 kg)   SpO2 96%   BMI 23.77 kg/m    Physical Exam Exam conducted with a chaperone present.  Constitutional:      Appearance: Normal appearance.  HENT:     Head: Normocephalic and atraumatic.  Cardiovascular:     Rate and Rhythm: Normal rate and regular rhythm.     Heart sounds: Normal heart sounds.  Pulmonary:     Effort: No respiratory distress.     Breath sounds: Normal breath sounds. No wheezing.  Chest:       Comments: Patient noted to have a small, nontender, mobile nodule at the tail of her left breast Abdominal:     General: Bowel sounds are normal. There is no distension.     Palpations: Abdomen is soft.     Tenderness: There is no abdominal tenderness.  Neurological:      General: No focal deficit present.     Mental Status: She is alert and oriented to person, place, and time.  Psychiatric:        Mood and Affect: Mood normal.        Behavior: Behavior normal.     No results found for any visits on 10/23/23.      Assessment & Plan:   Problem List Items Addressed This Visit       Other   Breast nodule - Primary   - Patient complained of a feeling of intermittent fullness at the tail of the left breast and states that this feeling has been more frequent than before -In the presence of a chaperone (CMA [place]) partial left breast exam was done in the area that she indicated and she was felt to have a small mobile nodule -Patient denies any weight loss or fevers or chills.  No diaphoresis or night sweats.  No nipple discharge noted -Patient did have a diagnostic mammogram in 2022 and screening mammograms in 2023 and 2024 which were unrevealing -Would obtain a left-sided diagnostic mammogram as well as ultrasound for further evaluation -No further workup at this time      Relevant  Orders   MM 3D DIAGNOSTIC MAMMOGRAM UNILATERAL LEFT BREAST   US  BREAST COMPLETE UNI LEFT INC AXILLA   Hypercholesteremia   - This problem is chronic and stable -Patient's last LDL in May was mildly elevated at 107 - Will continue with rosuvastatin  20 mg daily -No further workup at this time      Relevant Orders   Comprehensive metabolic panel with GFR   Vitamin B 12 deficiency   - Patient has history of vitamin B-12 deficiency on oral B12 supplementation -Her last B12 level was elevated -Will recheck her vitamin B12 level today -No further workup at this time      Relevant Orders   Comprehensive metabolic panel with GFR   Vitamin B12   Vitamin D  deficiency   - This problem is chronic and stable -Patient has history of vitamin D  deficiency and is currently on over-the-counter vitamin D  -Her last vitamin D  level was low normal at 30.36 - we will recheck  vitamin D  level today      Relevant Orders   Comprehensive metabolic panel with GFR   VITAMIN D  25 Hydroxy (Vit-D Deficiency, Fractures)    No orders of the defined types were placed in this encounter.   No follow-ups on file.  Makahla Kiser, MD

## 2023-10-23 NOTE — Assessment & Plan Note (Signed)
-   Patient has history of vitamin B-12 deficiency on oral B12 supplementation -Her last B12 level was elevated -Will recheck her vitamin B12 level today -No further workup at this time

## 2023-10-24 ENCOUNTER — Other Ambulatory Visit: Payer: Self-pay | Admitting: Family Medicine

## 2023-10-24 DIAGNOSIS — M8580 Other specified disorders of bone density and structure, unspecified site: Secondary | ICD-10-CM

## 2023-10-24 DIAGNOSIS — Z9189 Other specified personal risk factors, not elsewhere classified: Secondary | ICD-10-CM

## 2023-10-24 LAB — COMPREHENSIVE METABOLIC PANEL WITH GFR
ALT: 15 U/L (ref 0–35)
AST: 17 U/L (ref 0–37)
Albumin: 4.3 g/dL (ref 3.5–5.2)
Alkaline Phosphatase: 63 U/L (ref 39–117)
BUN: 26 mg/dL — ABNORMAL HIGH (ref 6–23)
CO2: 28 meq/L (ref 19–32)
Calcium: 9.5 mg/dL (ref 8.4–10.5)
Chloride: 103 meq/L (ref 96–112)
Creatinine, Ser: 0.55 mg/dL (ref 0.40–1.20)
GFR: 88.28 mL/min (ref >60.00)
Glucose, Bld: 87 mg/dL (ref 70–99)
Potassium: 4.4 meq/L (ref 3.5–5.1)
Sodium: 139 meq/L (ref 135–145)
Total Bilirubin: 0.6 mg/dL (ref 0.2–1.2)
Total Protein: 7.3 g/dL (ref 6.0–8.3)

## 2023-10-24 LAB — VITAMIN B12: Vitamin B-12: 775 pg/mL (ref 211–911)

## 2023-10-24 LAB — VITAMIN D 25 HYDROXY (VIT D DEFICIENCY, FRACTURES): VITD: 38.02 ng/mL (ref 30.00–100.00)

## 2023-10-25 ENCOUNTER — Ambulatory Visit: Payer: Self-pay | Admitting: Internal Medicine

## 2023-10-26 NOTE — Telephone Encounter (Signed)
 I have never seen this patient. Her appointment with me is not till 04/2024. I am not going to refill her medications till she is under my care.    Thank you,  Luke Shade, MD

## 2023-10-30 ENCOUNTER — Ambulatory Visit
Admission: RE | Admit: 2023-10-30 | Discharge: 2023-10-30 | Disposition: A | Discharge status: Home / Self Care | Source: Ambulatory Visit | Attending: Internal Medicine

## 2023-10-30 ENCOUNTER — Ambulatory Visit
Admission: RE | Admit: 2023-10-30 | Discharge: 2023-10-30 | Disposition: A | Discharge status: Home / Self Care | Source: Ambulatory Visit | Attending: Internal Medicine | Admitting: Internal Medicine

## 2023-10-30 DIAGNOSIS — N6321 Unspecified lump in the left breast, upper outer quadrant: Secondary | ICD-10-CM | POA: Insufficient documentation

## 2023-10-30 DIAGNOSIS — N63 Unspecified lump in unspecified breast: Secondary | ICD-10-CM | POA: Insufficient documentation

## 2023-10-30 DIAGNOSIS — R92322 Mammographic fibroglandular density, left breast: Secondary | ICD-10-CM | POA: Diagnosis not present

## 2023-10-30 NOTE — Telephone Encounter (Signed)
 Patient states it was refilled on Thursday and she has enough to last until 10/25 and her [facility] appointment is 1/26. Patient states she thinks she will have to stop the Fosamax  due to being on it for 7 years so she will discuss this at the 126 appointment.

## 2023-10-30 NOTE — Telephone Encounter (Signed)
 Left message for patient to give our office a call back to clarify if she is in need of the medication.   OK for E2C2 to clarify if the pt is needing the refill request. Once clarified, please notify the office.

## 2023-10-30 NOTE — Telephone Encounter (Signed)
 Patient was called to schedule a medication refill appointment. Patient did not understand why she needed an appointment. She states she just received a 1m supply of fosamax  last week. No appointment scheduled.

## 2023-10-31 ENCOUNTER — Telehealth: Payer: Self-pay

## 2023-10-31 NOTE — Telephone Encounter (Signed)
 Noted.  Amanda Mascot, MD

## 2023-10-31 NOTE — Telephone Encounter (Signed)
 Copied from CRM [id number]. Topic: General - Call Back - No Documentation >> Oct 31, 2023 10:21 AM [name] wrote: Reason for CRM: FYI- Patient stated she received multiple calls yesterday about her alendronate  (FOSAMAX ) 70 MG tablet prescription. She stated that she is unsure if she will be staying on it after her physical in January due to how long she's been taking the prescription for and does not want to make a separate appointment to be seen to discuss it.

## 2024-01-30 ENCOUNTER — Ambulatory Visit: Payer: Self-pay | Admitting: Podiatry

## 2024-01-30 DIAGNOSIS — Q666 Other congenital valgus deformities of feet: Secondary | ICD-10-CM | POA: Diagnosis not present

## 2024-01-30 DIAGNOSIS — M19071 Primary osteoarthritis, right ankle and foot: Secondary | ICD-10-CM

## 2024-01-30 NOTE — Progress Notes (Signed)
 Subjective:  Patient ID: Amanda Walton, female    DOB: 03-Dec-1945,  MRN: [id number]  Chief Complaint  Patient presents with   Foot Pain    Pain on the top of right foot     78 y.o. female presents with the above complaint.  Patient presents with right dorsal midfoot pain.  She states been on for quite some time.  She wanted get it evaluated pain scale 7 out of 10 dull aching nature worse with ambulation worse with pressure she has secondary complaint of not wearing orthotics.  She wants to discuss getting orthotics.  Denies any other acute issues.   Review of Systems: Negative except as noted in the HPI. Denies N/V/F/Ch.  Past Medical History:  Diagnosis Date   Arthritis    Hyperlipidemia     Current Outpatient Medications:    alendronate  (FOSAMAX ) 70 MG tablet, TAKE ONE TABLET BY MOUTH EACH WEEK, ON AN EMPTY STOMACH BEFORE BREAKFAST WITH 8oz OF WATER AND REMAIN UPRIGHT FOR :30, Disp: 12 tablet, Rfl: 3   CALCIUM  CARBONATE-VITAMIN D  PO, Take 2 tablets by mouth daily. , Disp: , Rfl:    Cyanocobalamin  (VITAMIN B-12) 5000 MCG TBDP, Take 1 tablet by mouth daily at 6 (six) AM., Disp: , Rfl:    GLUCOSAMINE-CALCIUM -VIT D PO, Take 1 tablet by mouth daily in the afternoon., Disp: , Rfl:    latanoprost (XALATAN) 0.005 % ophthalmic solution, Place 2 drops into both eyes at bedtime., Disp: , Rfl:    rosuvastatin  (CRESTOR ) 20 MG tablet, Take 1 tablet (20 mg total) by mouth daily., Disp: 90 tablet, Rfl: 3  Social History   Tobacco Use  Smoking Status Former   Current packs/day: 0.00   Average packs/day: 0.5 packs/day for 10.0 years (5.0 ttl pk-yrs)   Types: Cigarettes   Quit date: 04/10/1989   Years since quitting: 34.8  Smokeless Tobacco Never    No Active Allergies Objective:  There were no vitals filed for this visit. There is no height or weight on file to calculate BMI. Constitutional Well developed. Well nourished.  Vascular Dorsalis pedis pulses palpable  bilaterally. Posterior tibial pulses palpable bilaterally. Capillary refill normal to all digits.  No cyanosis or clubbing noted. Pedal hair growth normal.  Neurologic Normal speech. Oriented to person, place, and time. Epicritic sensation to light touch grossly present bilaterally.  Dermatologic Nails well groomed and normal in appearance. No open wounds. No skin lesions.  Orthopedic: Pain on palpation right dorsal midfoot pes planovalgus foot structure noted clinically able to appreciate underlying arthritis.  No open wounds or lesion noted.   Radiographs: None Assessment:   1. Arthritis of right midfoot   2. Pes planovalgus    Plan:  Patient was evaluated and treated and all questions answered.  Right dorsal midfoot arthritis - All questions and concerns were discussed with the patient in extensive detail given the amount of pain that she is having she would benefit from a steroid injection because significant to combat surgical pain.  Patient agrees with plan to proceed with steroid injection  Pes planovalgus/foot deformity -I explained to patient the etiology of pes planovalgus and relationship with heel pain/arch pain and various treatment options were discussed.  Given patient foot structure in the setting of heel pain/arch pain I believe patient will benefit from custom-made orthotics to help control the hindfoot motion support the arch of the foot and take the stress away from arches.  Patient agrees with the plan like to proceed with orthotics -Patient was  casted for orthotics    No follow-ups on file.

## 2024-02-07 DIAGNOSIS — Z86018 Personal history of other benign neoplasm: Secondary | ICD-10-CM | POA: Diagnosis not present

## 2024-02-07 DIAGNOSIS — M71342 Other bursal cyst, left hand: Secondary | ICD-10-CM | POA: Diagnosis not present

## 2024-02-07 DIAGNOSIS — B009 Herpesviral infection, unspecified: Secondary | ICD-10-CM | POA: Diagnosis not present

## 2024-02-07 DIAGNOSIS — D225 Melanocytic nevi of trunk: Secondary | ICD-10-CM | POA: Diagnosis not present

## 2024-02-07 DIAGNOSIS — L578 Other skin changes due to chronic exposure to nonionizing radiation: Secondary | ICD-10-CM | POA: Diagnosis not present

## 2024-02-07 DIAGNOSIS — D485 Neoplasm of uncertain behavior of skin: Secondary | ICD-10-CM | POA: Diagnosis not present

## 2024-02-13 DIAGNOSIS — H40153 Residual stage of open-angle glaucoma, bilateral: Secondary | ICD-10-CM | POA: Diagnosis not present

## 2024-02-16 ENCOUNTER — Telehealth: Payer: Self-pay

## 2024-02-16 ENCOUNTER — Other Ambulatory Visit: Payer: Self-pay | Admitting: Nurse Practitioner

## 2024-02-16 DIAGNOSIS — Z1231 Encounter for screening mammogram for malignant neoplasm of breast: Secondary | ICD-10-CM

## 2024-02-16 NOTE — Telephone Encounter (Signed)
 Copied from CRM #[id number]. Topic: Clinical - Request for Lab/Test Order >> Feb 16, 2024  1:34 PM Paige D wrote: Reason for CRM: PT is calling she needs a mammograms order entered she would like some one to give her a call to let her know when the order is entered so she can get this scheduled and taken care of. Best Call back # [phone].

## 2024-02-16 NOTE — Telephone Encounter (Signed)
Pt informed

## 2024-02-27 ENCOUNTER — Telehealth: Payer: Self-pay

## 2024-02-27 NOTE — Telephone Encounter (Signed)
 Called patient to schedule an [facility] appointment in [place]. Unable to connect with patient. LVM to have patient call back to schedule her appointment in [place] to [facility].

## 2024-03-14 ENCOUNTER — Ambulatory Visit (INDEPENDENT_AMBULATORY_CARE_PROVIDER_SITE_OTHER): Admitting: Podiatry

## 2024-03-14 DIAGNOSIS — Q666 Other congenital valgus deformities of feet: Secondary | ICD-10-CM

## 2024-03-14 NOTE — Progress Notes (Signed)
 Orthotics were dispensed they are functioning well no acute complaints.  Break-in period was discussed.  They are fitting well.

## 2024-03-25 ENCOUNTER — Ambulatory Visit
Admission: RE | Admit: 2024-03-25 | Discharge: 2024-03-25 | Disposition: A | Source: Ambulatory Visit | Attending: Nurse Practitioner

## 2024-03-25 DIAGNOSIS — Z1231 Encounter for screening mammogram for malignant neoplasm of breast: Secondary | ICD-10-CM | POA: Insufficient documentation

## 2024-04-02 ENCOUNTER — Ambulatory Visit: Payer: Self-pay | Admitting: Nurse Practitioner

## 2024-05-01 ENCOUNTER — Encounter

## 2024-05-07 ENCOUNTER — Ambulatory Visit

## 2024-05-07 VITALS — Ht 63.0 in | Wt 132.0 lb

## 2024-05-07 DIAGNOSIS — Z0000 Encounter for general adult medical examination without abnormal findings: Secondary | ICD-10-CM | POA: Diagnosis not present

## 2024-05-07 NOTE — Progress Notes (Signed)
 "  Chief Complaint  Patient presents with   Medicare Wellness     Subjective:   Amanda Walton is a 79 y.o. female who presents for a Medicare Annual Wellness Visit.  Visit info / Clinical Intake: Medicare Wellness Visit Type:: Subsequent Annual Wellness Visit Persons participating in visit and providing information:: patient Medicare Wellness Visit Mode:: Telephone If telephone:: video declined Since this visit was completed virtually, some vitals may be partially provided or unavailable. Missing vitals are due to the limitations of the virtual format.: Unable to obtain vitals - no equipment If Telephone or Video please confirm:: I connected with patient using audio/video enable telemedicine. I verified patient identity with two identifiers, discussed telehealth limitations, and patient agreed to proceed. Patient Location:: [place]/product Designer:: Office/Home Interpreter Needed?: No Pre-visit prep was completed: yes AWV questionnaire completed by patient prior to visit?: no Living arrangements:: lives with spouse/significant other Patient's Overall Health Status Rating: very good Typical amount of pain: some (hip pain for months off and on) Does pain affect daily life?: no Are you currently prescribed opioids?: no  Dietary Habits and Nutritional Risks How many meals a day?: 3 Eats fruit and vegetables daily?: yes Most meals are obtained by: preparing own meals In the last 2 weeks, have you had any of the following?: none Diabetic:: no  Functional Status Activities of Daily Living (to include ambulation/medication): Independent Ambulation: Independent Medication Administration: Independent Home Management (perform basic housework or laundry): Independent Manage your own finances?: yes Primary transportation is: driving Concerns about vision?: no *vision screening is required for WTM* Concerns about hearing?: no  Fall Screening Falls in the past year?: 0 Number  of falls in past year: 0 Was there an injury with Fall?: 0 Fall Risk Category Calculator: 0 Patient Fall Risk Level: Low Fall Risk  Fall Risk Patient at Risk for Falls Due to: No Fall Risks Fall risk Follow up: Falls evaluation completed; Falls prevention discussed  Home and Transportation Safety: All rugs have non-skid backing?: N/A, no rugs All stairs or steps have railings?: yes Grab bars in the bathtub or shower?: yes Have non-skid surface in bathtub or shower?: yes Good home lighting?: yes Regular seat belt use?: yes Hospital stays in the last year:: no  Cognitive Assessment Difficulty concentrating, remembering, or making decisions? : no Will 6CIT or Mini Cog be Completed: yes What year is it?: 0 points What month is it?: 0 points Give patient an address phrase to remember (5 components): [address] [place] About what time is it?: 0 points Count backwards from 20 to 1: 0 points Say the months of the year in reverse: 0 points Repeat the address phrase from earlier: 0 points 6 CIT Score: 0 points  Advance Directives (For Healthcare) Does Patient Have a Medical Advance Directive?: Yes Does patient want to make changes to medical advance directive?: No - Patient declined Type of Advance Directive: Healthcare Power of [place]; Living will Copy of Healthcare Power of Attorney in Chart?: Yes - validated most recent copy scanned in chart (See row information) Copy of Living Will in Chart?: Yes - validated most recent copy scanned in chart (See row information)  Reviewed/Updated  Reviewed/Updated: Reviewed All (Medical, Surgical, Family, Medications, Allergies, Care Teams, Patient Goals)    Allergies (verified) Patient has no active allergies.   Current Medications (verified) Outpatient Encounter Medications as of 05/07/2024  Medication Sig   CALCIUM  CARBONATE-VITAMIN D  PO Take 2 tablets by mouth daily.    Cyanocobalamin  (  VITAMIN B-12) 5000 MCG TBDP Take 1 tablet  by mouth daily at 6 (six) AM.   GLUCOSAMINE-CALCIUM -VIT D PO Take 1 tablet by mouth daily in the afternoon. (Patient taking differently: Take 1 tablet by mouth 2 (two) times daily.)   latanoprost (XALATAN) 0.005 % ophthalmic solution Place 2 drops into both eyes at bedtime.   rosuvastatin  (CRESTOR ) 20 MG tablet Take 1 tablet (20 mg total) by mouth daily.   alendronate  (FOSAMAX ) 70 MG tablet TAKE ONE TABLET BY MOUTH EACH WEEK, ON AN EMPTY STOMACH BEFORE BREAKFAST WITH 8oz OF WATER AND REMAIN UPRIGHT FOR :30 (Patient not taking: Reported on 05/07/2024)   No facility-administered encounter medications on file as of 05/07/2024.    History: Past Medical History:  Diagnosis Date   Arthritis    Hyperlipidemia    Past Surgical History:  Procedure Laterality Date   EYE SURGERY  01/05/22   Cataracts   JOINT REPLACEMENT  07/24/2016   TONSILLECTOMY AND ADENOIDECTOMY     TOTAL HIP ARTHROPLASTY Right 08/04/2015   Procedure: RIGHT TOTAL HIP ARTHROPLASTY ANTERIOR APPROACH;  Surgeon: Donnice Car, MD;  Location: [facility];  Service: Orthopedics;  Laterality: Right;   TUBAL LIGATION     Family History  Problem Relation Age of Onset   Dementia Mother    Hypertension Father    Heart disease Father    Hyperlipidemia Sister    Cancer Sister    Pancreatic cancer Sister    Breast cancer Neg Hx    Social History   Occupational History   Occupation: retired    Comment: does some part time work at [employer]  Tobacco Use   Smoking status: Former    Current packs/day: 0.00    Average packs/day: 0.5 packs/day for 10.0 years (5.0 ttl pk-yrs)    Types: Cigarettes    Quit date: 04/10/1989    Years since quitting: 35.0   Smokeless tobacco: Never  Vaping Use   Vaping status: Never Used  Substance and Sexual Activity   Alcohol  use: Yes    Alcohol /week: 9.0 standard drinks of alcohol     Types: 9 Glasses of wine per week    Comment: 1 glass M-F, a couple on the weekend   Drug use: Never   Sexual activity:  Not Currently    Birth control/protection: None   Tobacco Counseling Counseling given: Not Answered  SDOH Screenings   Food Insecurity: No Food Insecurity (05/07/2024)  Housing: Low Risk (05/07/2024)  Transportation Needs: No Transportation Needs (05/07/2024)  Utilities: Not At Risk (05/07/2024)  Alcohol  Screen: Low Risk (05/07/2024)  Depression (PHQ2-9): Low Risk (05/07/2024)  Financial Resource Strain: Low Risk (05/07/2024)  Physical Activity: Insufficiently Active (05/07/2024)  Social Connections: Socially Integrated (05/07/2024)  Stress: No Stress Concern Present (05/07/2024)  Tobacco Use: Medium Risk (05/07/2024)  Health Literacy: Adequate Health Literacy (05/07/2024)   See flowsheets for full screening details  Depression Screen PHQ 2 & 9 Depression Scale- Over the past 2 weeks, how often have you been bothered by any of the following problems? Little interest or pleasure in doing things: 0 Feeling down, depressed, or hopeless (PHQ Adolescent also includes...irritable): 0 PHQ-2 Total Score: 0 Trouble falling or staying asleep, or sleeping too much: 0 Feeling tired or having little energy: 0 Poor appetite or overeating (PHQ Adolescent also includes...weight loss): 0 Feeling bad about yourself - or that you are a failure or have let yourself or your family down: 0 Trouble concentrating on things, such as reading the newspaper or watching television (PHQ  Adolescent also includes...like school work): 0 Moving or speaking so slowly that other people could have noticed. Or the opposite - being so fidgety or restless that you have been moving around a lot more than usual: 0 Thoughts that you would be better off dead, or of hurting yourself in some way: 0 PHQ-9 Total Score: 0 If you checked off any problems, how difficult have these problems made it for you to do your work, take care of things at home, or get along with other people?: Not difficult at all     Goals Addressed              This Visit's Progress    Patient Stated       Wants to continue to eat right and exercise             Objective:    Today's Vitals   05/07/24 1504  Weight: 132 lb (59.9 kg)  Height: 5' 3 (1.6 m)   Body mass index is 23.38 kg/m.  Hearing/Vision screen Hearing Screening - Comments:: No issues Vision Screening - Comments:: Readers, Dr. Carolee, up to date Immunizations and Health Maintenance Health Maintenance  Topic Date Due   Zoster Vaccines- Shingrix (1 of 2) 03/06/1965   DTaP/Tdap/Td (2 - Td or Tdap) 07/07/2015   Bone Density Scan  07/14/2023   Influenza Vaccine  11/10/2023   COVID-19 Vaccine (5 - 2025-26 season) 12/11/2023   Medicare Annual Wellness (AWV)  05/07/2025   Mammogram  03/25/2026   Pneumococcal Vaccine: 50+ Years  Completed   Hepatitis C Screening  Completed   Meningococcal B Vaccine  Aged Out   Colonoscopy  Discontinued        Assessment/Plan:  This is a routine wellness examination for [facility].  Patient Care Team: Connell Davies, MD (Inactive) as Referring Physician (Obstetrics and Gynecology) Dasher, Alm LABOR, MD as Consulting Physician (Dermatology) Carolee Manus DASEN., MD as Consulting Physician (Ophthalmology) Tobie Franky SQUIBB, DPM as Consulting Physician (Podiatry) Cathlyn Seal, MD as Referring Physician (Dermatology)  I have personally reviewed and noted the following in the patients chart:   Medical and social history Use of alcohol , tobacco or illicit drugs  Current medications and supplements including opioid prescriptions. Functional ability and status Nutritional status Physical activity Advanced directives List of other physicians Hospitalizations, surgeries, and ER visits in previous 12 months Vitals Screenings to include cognitive, depression, and falls Referrals and appointments  No orders of the defined types were placed in this encounter.  In addition, I have reviewed and discussed with patient certain preventive  protocols, quality metrics, and best practice recommendations. A written personalized care plan for preventive services as well as general preventive health recommendations were provided to patient.   Angeline Fredericks, LPN   8/72/7973   Return in 1 year (on 05/07/2025).  After Visit Summary: (MyChart) Due to this being a telephonic visit, the after visit summary with patients personalized plan was offered to patient via MyChart   Nurse Notes: Discussed the need to update tetanus vaccine. Patient declines covid vaccine. Patient has a transfer of care appointment scheduled March 2026 and will discuss a Dexa scan with her new PCP at that visit.  "

## 2024-05-07 NOTE — Patient Instructions (Addendum)
 Amanda Walton,  Thank you for taking the time for your Medicare Wellness Visit. I appreciate your continued commitment to your health goals. Please review the care plan we discussed, and feel free to reach out if I can assist you further.  Please note that Annual Wellness Visits do not include a physical exam. Some assessments may be limited, especially if the visit was conducted virtually. If needed, we may recommend an in-person follow-up with your provider.  Ongoing Care Seeing your primary care provider every 3 to 6 months helps us  monitor your health and provide consistent, personalized care.  Remember to update your tetanus vaccine. Your shingles vaccines have been updated in your chart.   Referrals If a referral was made during today's visit and you haven't received any updates within two weeks, please contact the referred provider directly to check on the status.  Recommended Screenings:  Health Maintenance  Topic Date Due   Zoster (Shingles) Vaccine (1 of 2) 03/06/1965   DTaP/Tdap/Td vaccine (2 - Td or Tdap) 07/07/2015   Osteoporosis screening with Bone Density Scan  07/14/2023   COVID-19 Vaccine (5 - 2025-26 season) 12/11/2023   Breast Cancer Screening  03/25/2025   Medicare Annual Wellness Visit  05/07/2025   Pneumococcal Vaccine for age over 64  Completed   Flu Shot  Completed   Hepatitis C Screening  Completed   Meningitis B Vaccine  Aged Out   Colon Cancer Screening  Discontinued       05/07/2024    3:07 PM  Advanced Directives  Does Patient Have a Medical Advance Directive? Yes  Type of Estate Agent of [place];Living will  Does patient want to make changes to medical advance directive? No - Patient declined  Copy of Healthcare Power of Attorney in Chart? Yes - validated most recent copy scanned in chart (See row information)    Vision: Annual vision screenings are recommended for early detection of glaucoma, cataracts, and diabetic  retinopathy. These exams can also reveal signs of chronic conditions such as diabetes and high blood pressure.  Dental: Annual dental screenings help detect early signs of oral cancer, gum disease, and other conditions linked to overall health, including heart disease and diabetes.  Please see the attached documents for additional preventive care recommendations.

## 2024-06-28 ENCOUNTER — Encounter

## 2025-05-14 ENCOUNTER — Ambulatory Visit
